# Patient Record
Sex: Male | Born: 1970 | Race: Black or African American | Hispanic: No | Marital: Married | Smoking: Never smoker
Health system: Southern US, Community
[De-identification: ages and names within clinical notes are randomized; demographics above are authoritative.]

---

## 2003-04-10 ENCOUNTER — Encounter: Admission: RE | Admit: 2003-04-10 | Discharge: 2003-04-10 | Payer: Self-pay | Admitting: Family Medicine

## 2003-12-02 ENCOUNTER — Emergency Department (HOSPITAL_COMMUNITY): Admission: EM | Admit: 2003-12-02 | Discharge: 2003-12-02 | Payer: Self-pay | Admitting: Emergency Medicine

## 2003-12-06 ENCOUNTER — Encounter: Admission: RE | Admit: 2003-12-06 | Discharge: 2003-12-06 | Payer: Self-pay | Admitting: Family Medicine

## 2017-09-24 ENCOUNTER — Emergency Department (HOSPITAL_BASED_OUTPATIENT_CLINIC_OR_DEPARTMENT_OTHER): Payer: Self-pay

## 2017-09-24 ENCOUNTER — Encounter (HOSPITAL_BASED_OUTPATIENT_CLINIC_OR_DEPARTMENT_OTHER): Payer: Self-pay | Admitting: *Deleted

## 2017-09-24 ENCOUNTER — Other Ambulatory Visit: Payer: Self-pay

## 2017-09-24 ENCOUNTER — Emergency Department (HOSPITAL_BASED_OUTPATIENT_CLINIC_OR_DEPARTMENT_OTHER)
Admission: EM | Admit: 2017-09-24 | Discharge: 2017-09-24 | Disposition: A | Discharge status: Home / Self Care | Payer: Self-pay | Attending: Emergency Medicine | Admitting: Emergency Medicine

## 2017-09-24 DIAGNOSIS — R197 Diarrhea, unspecified: Secondary | ICD-10-CM | POA: Insufficient documentation

## 2017-09-24 DIAGNOSIS — R0789 Other chest pain: Secondary | ICD-10-CM | POA: Insufficient documentation

## 2017-09-24 DIAGNOSIS — R112 Nausea with vomiting, unspecified: Secondary | ICD-10-CM | POA: Insufficient documentation

## 2017-09-24 DIAGNOSIS — R0602 Shortness of breath: Secondary | ICD-10-CM | POA: Insufficient documentation

## 2017-09-24 LAB — COMPREHENSIVE METABOLIC PANEL
ALT: 27 U/L (ref 0–44)
AST: 34 U/L (ref 15–41)
Albumin: 4.6 g/dL (ref 3.5–5.0)
Alkaline Phosphatase: 67 U/L (ref 38–126)
Anion gap: 12 (ref 5–15)
BUN: 10 mg/dL (ref 6–20)
CO2: 24 mmol/L (ref 22–32)
Calcium: 9.9 mg/dL (ref 8.9–10.3)
Chloride: 103 mmol/L (ref 98–111)
Creatinine, Ser: 1.2 mg/dL (ref 0.61–1.24)
GFR calc Af Amer: >60 mL/min (ref >60)
GFR calc non Af Amer: >60 mL/min (ref >60)
Glucose, Bld: 136 mg/dL — ABNORMAL HIGH (ref 70–99)
Potassium: 3.9 mmol/L (ref 3.5–5.1)
Sodium: 139 mmol/L (ref 135–145)
Total Bilirubin: 1 mg/dL (ref 0.3–1.2)
Total Protein: 8.2 g/dL — ABNORMAL HIGH (ref 6.5–8.1)

## 2017-09-24 LAB — CBC
HCT: 44.1 % (ref 39.0–52.0)
Hemoglobin: 15.9 g/dL (ref 13.0–17.0)
MCH: 32.3 pg (ref 26.0–34.0)
MCHC: 36.1 g/dL — ABNORMAL HIGH (ref 30.0–36.0)
MCV: 89.6 fL (ref 78.0–100.0)
Platelets: 245 10*3/uL (ref 150–400)
RBC: 4.92 MIL/uL (ref 4.22–5.81)
RDW: 11.8 % (ref 11.5–15.5)
WBC: 11.2 10*3/uL — ABNORMAL HIGH (ref 4.0–10.5)

## 2017-09-24 LAB — TROPONIN I
Troponin I: <0.03 ng/mL (ref <0.03)
Troponin I: <0.03 ng/mL (ref <0.03)

## 2017-09-24 LAB — LIPASE, BLOOD: Lipase: 33 U/L (ref 11–51)

## 2017-09-24 MED ORDER — ONDANSETRON HCL 4 MG/2ML IJ SOLN
4.0000 mg | Freq: Once | INTRAMUSCULAR | Status: AC
  Administered 2017-09-24: 4 mg via INTRAVENOUS
  Filled 2017-09-24: qty 2

## 2017-09-24 MED ORDER — IOPAMIDOL (ISOVUE-370) INJECTION 76%
100.0000 mL | Freq: Once | INTRAVENOUS | Status: AC | PRN
  Administered 2017-09-24: 100 mL via INTRAVENOUS

## 2017-09-24 MED ORDER — MORPHINE SULFATE (PF) 4 MG/ML IV SOLN
4.0000 mg | Freq: Once | INTRAVENOUS | Status: AC
  Administered 2017-09-24: 4 mg via INTRAVENOUS
  Filled 2017-09-24: qty 1

## 2017-09-24 MED ORDER — ONDANSETRON 4 MG PO TBDP: 4.0000 mg | ORAL_TABLET | Freq: Three times a day (TID) | ORAL | 0 refills | Status: AC | PRN

## 2017-09-24 MED ORDER — SODIUM CHLORIDE 0.9 % IV BOLUS
500.0000 mL | Freq: Once | INTRAVENOUS | Status: AC
  Administered 2017-09-24: 500 mL via INTRAVENOUS

## 2017-09-24 NOTE — ED Notes (Signed)
Pt transported to CT at this time.

## 2017-09-24 NOTE — ED Notes (Signed)
ED Provider at bedside. 

## 2017-09-24 NOTE — ED Notes (Signed)
Pt provided with ginger ale at this time.

## 2017-09-24 NOTE — ED Triage Notes (Signed)
Vomiting last night. Today his left chest has been tight. He is pale and SOB.

## 2017-09-24 NOTE — ED Provider Notes (Signed)
[facility] EMERGENCY DEPARTMENT Provider Note   CSN: [id number] Arrival date & time: 09/24/17  1105     History   Chief Complaint Chief Complaint  Patient presents with  . Chest Pain    HPI Patrick Meyer is a 47 y.o. male.  The history is provided by the patient. No language interpreter was used.  Chest Pain     Patrick Meyer is a 47 y.o. male who presents to the Emergency Department complaining of chest pain, vomiting. Resents to the emergency department complaining of nausea, vomiting, chest pain and shortness of breath. Last night he began feeling sick with nausea with numerous episodes of emesis and a few episodes of diarrhea. He has associated shortness of breath. Around two or three this morning he developed chest tightness as well. He denies any fevers, cough, hematochezia, melena, hematemesis. He has no medical problems and takes no medications. He does not use tobacco, alcohol or drugs. History reviewed. No pertinent past medical history.  There are no active problems to display for this patient.   History reviewed. No pertinent surgical history.      Home Medications    Prior to Admission medications   Medication Sig Start Date End Date Taking? Authorizing Provider  ondansetron (ZOFRAN ODT) 4 MG disintegrating tablet Take 1 tablet (4 mg total) by mouth every 8 (eight) hours as needed for nausea or vomiting. 09/24/17   Tilden Fossaees, Tawni Melkonian, MD    Family History No family history on file.  Social History Social History   Tobacco Use  . Smoking status: Never Smoker  . Smokeless tobacco: Never Used  Substance Use Topics  . Alcohol use: Never    Frequency: Never  . Drug use: Never     Allergies   Patient has no known allergies.   Review of Systems Review of Systems  Cardiovascular: Positive for chest pain.  All other systems reviewed and are negative.    Physical Exam Updated Vital Signs BP 135/72 (BP Location: Right Arm)    Pulse 93   Temp 98.6 F (37 C) (Oral)   Resp 18   Ht 6\' 1"  (1.854 m)   Wt 104.3 kg   SpO2 98%   BMI 30.34 kg/m   Physical Exam  Constitutional: He is oriented to person, place, and time. He appears well-developed and well-nourished. He appears distressed.  Uncomfortable appearing  HENT:  Head: Normocephalic and atraumatic.  Cardiovascular: Normal rate and regular rhythm.  No murmur heard. Pulmonary/Chest: Effort normal and breath sounds normal. No respiratory distress.  Abdominal: Soft. There is no tenderness. There is no rebound and no guarding.  Musculoskeletal: He exhibits no edema or tenderness.  Neurological: He is alert and oriented to person, place, and time.  Skin: Skin is warm and dry.  Psychiatric: He has a normal mood and affect. His behavior is normal.  Nursing note and vitals reviewed.    ED Treatments / Results  Labs (all labs ordered are listed, but only abnormal results are displayed) Labs Reviewed  CBC - Abnormal; Notable for the following components:      Result Value   WBC 11.2 (*)    MCHC 36.1 (*)    All other components within normal limits  COMPREHENSIVE METABOLIC PANEL - Abnormal; Notable for the following components:   Glucose, Bld 136 (*)    Total Protein 8.2 (*)    All other components within normal limits  TROPONIN I  LIPASE, BLOOD  TROPONIN I  EKG EKG Interpretation  Date/Time:  Friday September 24 2017 11:18:22 EDT Ventricular Rate:  91 PR Interval:    QRS Duration: 87 QT Interval:  336 QTC Calculation: 414 R Axis:   71 Text Interpretation:  Sinus rhythm Borderline T wave abnormalities Minimal ST elevation, inferior leads Baseline wander in lead(s) V1 V2 V3 V4 V5 V6 no prior available for comparison Confirmed by Tilden Fossa [phone]) on 09/24/2017 11:24:26 AM   Radiology Dg Chest 2 View  Result Date: 09/24/2017 CLINICAL DATA:  Chest pain EXAM: CHEST - 2 VIEW COMPARISON:  None. FINDINGS: Lungs are clear. Heart size and pulmonary  vascularity are normal. No adenopathy. No pneumothorax. No bone lesions. IMPRESSION: No edema or consolidation. Electronically Signed   By: Bretta Bang III M.D.   On: 09/24/2017 12:19   Ct Angio Chest/abd/pel For Dissection W And/or W/wo  Result Date: 09/24/2017 CLINICAL DATA:  Vomiting. Chest pain and shortness of breath. Evaluate for thoracic aortic dissection EXAM: CT ANGIOGRAPHY CHEST, ABDOMEN AND PELVIS TECHNIQUE: Multidetector CT imaging through the chest, abdomen and pelvis was performed using the standard protocol during bolus administration of intravenous contrast. Multiplanar reconstructed images and MIPs were obtained and reviewed to evaluate the vascular anatomy. CONTRAST:  ISOVUE-370 IOPAMIDOL (ISOVUE-370) INJECTION 76% COMPARISON:  None. FINDINGS: CTA CHEST FINDINGS Vascular Findings: Review of the precontrast images are negative for the presence of an intramural hematoma. No evidence of thoracic aortic aneurysm with measurements as follows. No evidence of thoracic aortic dissection or periaortic stranding on this nongated examination. Bovine configuration of the aortic arch. The branch vessels of the aortic arch appear widely patent throughout their imaged course. Normal heart size.  No pericardial effusion. Although this examination was not tailored for the evaluation the pulmonary arteries, there are no discrete filling defects within the central pulmonary arterial tree to suggest central pulmonary embolism. Normal caliber of the main pulmonary artery. ------------------------------------------------------------- Thoracic aortic measurements: Sinotubular junction 31 mm as measured in greatest oblique coronal dimension. Proximal ascending aorta 30 mm as measured in greatest oblique axial dimension at the level of the main pulmonary artery. Aortic arch aorta 23 mm as measured in greatest oblique sagittal dimension. Proximal descending thoracic aorta 21 mm as measured in greatest oblique  axial dimension at the level of the main pulmonary artery. Distal descending thoracic aorta 23 mm as measured in greatest oblique axial dimension at the level of the diaphragmatic hiatus. Review of the MIP images confirms the above findings. ------------------------------------------------------------- Non-Vascular Findings: Mediastinum/Lymph Nodes: No bulky mediastinal, hilar axillary lymphadenopathy. There is a minimal amount of ill-defined soft tissue within the anterior mediastinum which is favored to represent residual thymic tissue. No associated mass effect. Lungs/Pleura: No focal airspace opacities. No pleural effusion or pneumothorax. The central pulmonary airways appear widely patent. No discrete pulmonary nodules. Musculoskeletal: Regional soft tissues appear normal. No radiopaque foreign body. Moderate DDD of C6-C7 with disc space height loss, endplate irregularity and sclerosis. _________________________________________________________ _________________________________________________________ CTA ABDOMEN AND PELVIS FINDINGS VASCULAR Aorta: Normal caliber of the abdominal aorta. No significant atherosclerotic plaque. No abdominal aortic dissection or periaortic stranding. Celiac: Widely patent without hemodynamically significant narrowing. Conventional branching pattern. SMA: Widely patent without hemodynamically significant narrowing. Conventional branching pattern the distal tributaries the SMA appear widely patent without discrete intraluminal filling defect to suggest distal embolism. Renals: The bilateral renal arteries appear widely patent. No vessel irregularity to suggest FMD. IMA: Widely patent. Inflow: The bilateral common, external and internal iliac arteries are of normal caliber and widely patent  without hemodynamically significant stenosis. Veins: The IVC and pelvic venous system appear widely patent on this arterial phase examination. Review of the MIP images confirms the above findings.  NON-VASCULAR Evaluation the abdominal organs is limited to the arterial phase of enhancement. Hepatobiliary: Normal hepatic contour. Punctate (approximately 0.6 cm) hypoattenuating lesion within the left lobe of the liver is too small to adequately characterize though favored to represent a hepatic cyst. No discrete hyperenhancing hepatic lesions. Normal appearance of the gallbladder given degree distention. No radiopaque gallstones. No intra extrahepatic bili duct dilatation. No ascites. Pancreas: Normal appearance of the pancreas Spleen: Normal appearance of the spleen. Adrenals/Urinary Tract: There is symmetric enhancement of the bilateral kidneys. No definite renal stones this postcontrast examination. No discrete renal lesions. No urine obstruction or perinephric stranding. Normal appearance the bilateral adrenal glands. Normal appearance of the urinary bladder given degree distention. Stomach/Bowel: The bowel is normal in course and caliber without wall thickening or evidence of enteric obstruction. Normal appearance of the terminal ileum and appendix. No pneumoperitoneum, pneumatosis or portal venous gas. Lymphatic: No bulky retroperitoneal, mesenteric, pelvic or inguinal lymphadenopathy. Reproductive: Borderline enlarged prostate gland. Multiple phleboliths are seen with the lower pelvis bilaterally, left greater than right. Note is made of a small left-sided suspected varicocele (representative image 195, series 6). Other: Regional soft tissues appear normal. Musculoskeletal: No acute or aggressive osseous abnormalities. 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 Review of the MIP images confirms the above findings. IMPRESSION: 1. No acute cardiopulmonary disease. Specifically, no evidence of thoracic aortic aneurysm, dissection or central pulmonary embolism. 2. No acute findings within the abdomen or pelvis.  Electronically Signed   By: Simonne Come M.D.   On: 09/24/2017 13:46    Procedures Procedures (including critical care time)  Medications Ordered in ED Medications  ondansetron (ZOFRAN) injection 4 mg (4 mg Intravenous Given 09/24/17 1129)  sodium chloride 0.9 % bolus 500 mL (0 mLs Intravenous Stopped 09/24/17 1226)  morphine 4 MG/ML injection 4 mg (4 mg Intravenous Given 09/24/17 1142)  iopamidol (ISOVUE-370) 76 % injection 100 mL (100 mLs Intravenous Contrast Given 09/24/17 1316)     Initial Impression / Assessment and Plan / ED Course  I have reviewed the triage vital signs and the nursing notes.  Pertinent labs & imaging results that were available during my care of the patient were reviewed by me and considered in my medical decision making (see chart for details).    Pt here for evaluation of chest pain, nausea after suffering nausea vomiting and diarrhea overnight. Patient distress on ED arrival and uncomfortable appearing. EKG without acute ischemic changes. Following pain medications and antiemetics he is feeling significantly improved. Initial concern for possible dissection versus esophageal rupture. CTA dissection protocol obtained and is negative for acute perforation or dissection. Patient is able to tolerate oral's in the department without difficulty. Plan to discharge home with outpatient follow-up and return precautions. Final Clinical Impressions(s) / ED Diagnoses   Final diagnoses:  Atypical chest pain  Nausea vomiting and diarrhea    ED Discharge Orders         Ordered    ondansetron (ZOFRAN ODT) 4 MG disintegrating tablet  Every 8 hours PRN     09/24/17 1527           Tilden Fossa, MD 09/24/17 1549

## 2017-09-24 NOTE — ED Notes (Signed)
Pt to XR

## 2017-09-24 NOTE — ED Notes (Signed)
2 L oxygen applied to patient.  

## 2017-09-24 NOTE — ED Notes (Signed)
Pt returned from X Ray.

## 2017-09-25 ENCOUNTER — Observation Stay (HOSPITAL_BASED_OUTPATIENT_CLINIC_OR_DEPARTMENT_OTHER)
Admission: EM | Admit: 2017-09-25 | Discharge: 2017-09-26 | Disposition: A | Discharge status: Home / Self Care | Payer: Self-pay | Attending: Internal Medicine | Admitting: Internal Medicine

## 2017-09-25 ENCOUNTER — Other Ambulatory Visit: Payer: Self-pay

## 2017-09-25 ENCOUNTER — Encounter (HOSPITAL_BASED_OUTPATIENT_CLINIC_OR_DEPARTMENT_OTHER): Payer: Self-pay | Admitting: Emergency Medicine

## 2017-09-25 ENCOUNTER — Emergency Department (HOSPITAL_BASED_OUTPATIENT_CLINIC_OR_DEPARTMENT_OTHER): Payer: Self-pay

## 2017-09-25 DIAGNOSIS — N179 Acute kidney failure, unspecified: Secondary | ICD-10-CM | POA: Insufficient documentation

## 2017-09-25 DIAGNOSIS — K529 Noninfective gastroenteritis and colitis, unspecified: Principal | ICD-10-CM | POA: Insufficient documentation

## 2017-09-25 DIAGNOSIS — R0789 Other chest pain: Secondary | ICD-10-CM | POA: Insufficient documentation

## 2017-09-25 DIAGNOSIS — E872 Acidosis: Secondary | ICD-10-CM | POA: Insufficient documentation

## 2017-09-25 DIAGNOSIS — R197 Diarrhea, unspecified: Secondary | ICD-10-CM

## 2017-09-25 DIAGNOSIS — R6510 Systemic inflammatory response syndrome (SIRS) of non-infectious origin without acute organ dysfunction: Secondary | ICD-10-CM

## 2017-09-25 DIAGNOSIS — E860 Dehydration: Secondary | ICD-10-CM | POA: Insufficient documentation

## 2017-09-25 DIAGNOSIS — R0602 Shortness of breath: Secondary | ICD-10-CM | POA: Insufficient documentation

## 2017-09-25 DIAGNOSIS — R1013 Epigastric pain: Secondary | ICD-10-CM

## 2017-09-25 DIAGNOSIS — R112 Nausea with vomiting, unspecified: Secondary | ICD-10-CM

## 2017-09-25 DIAGNOSIS — R079 Chest pain, unspecified: Secondary | ICD-10-CM | POA: Diagnosis present

## 2017-09-25 LAB — RAPID URINE DRUG SCREEN, HOSP PERFORMED
Amphetamines: NOT DETECTED
BARBITURATES: NOT DETECTED
Benzodiazepines: NOT DETECTED
COCAINE: NOT DETECTED
Opiates: POSITIVE — AB
Tetrahydrocannabinol: NOT DETECTED

## 2017-09-25 LAB — COMPREHENSIVE METABOLIC PANEL
ALBUMIN: 4.5 g/dL (ref 3.5–5.0)
ALT: 26 U/L (ref 0–44)
ANION GAP: 13 (ref 5–15)
AST: 35 U/L (ref 15–41)
Alkaline Phosphatase: 64 U/L (ref 38–126)
BUN: 16 mg/dL (ref 6–20)
CHLORIDE: 103 mmol/L (ref 98–111)
CO2: 25 mmol/L (ref 22–32)
Calcium: 9.2 mg/dL (ref 8.9–10.3)
Creatinine, Ser: 1.4 mg/dL — ABNORMAL HIGH (ref 0.61–1.24)
GFR calc non Af Amer: 58 mL/min — ABNORMAL LOW (ref >60)
GLUCOSE: 125 mg/dL — AB (ref 70–99)
POTASSIUM: 3.6 mmol/L (ref 3.5–5.1)
Sodium: 141 mmol/L (ref 135–145)
Total Bilirubin: 0.9 mg/dL (ref 0.3–1.2)
Total Protein: 7.9 g/dL (ref 6.5–8.1)

## 2017-09-25 LAB — URINALYSIS, ROUTINE W REFLEX MICROSCOPIC
Bilirubin Urine: NEGATIVE
Glucose, UA: NEGATIVE mg/dL
Ketones, ur: 15 mg/dL — AB
Leukocytes, UA: NEGATIVE
NITRITE: NEGATIVE
PH: 8.5 — AB (ref 5.0–8.0)
Protein, ur: NEGATIVE mg/dL
Specific Gravity, Urine: 1.015 (ref 1.005–1.030)

## 2017-09-25 LAB — CBC WITH DIFFERENTIAL/PLATELET
Basophils Absolute: 0 10*3/uL (ref 0.0–0.1)
Basophils Relative: 0 %
Eosinophils Absolute: 0 10*3/uL (ref 0.0–0.7)
Eosinophils Relative: 0 %
HCT: 43.5 % (ref 39.0–52.0)
Hemoglobin: 15.6 g/dL (ref 13.0–17.0)
Lymphocytes Relative: 27 %
Lymphs Abs: 2.2 10*3/uL (ref 0.7–4.0)
MCH: 32.7 pg (ref 26.0–34.0)
MCHC: 35.9 g/dL (ref 30.0–36.0)
MCV: 91.2 fL (ref 78.0–100.0)
Monocytes Absolute: 0.6 10*3/uL (ref 0.1–1.0)
Monocytes Relative: 8 %
Neutro Abs: 5.3 10*3/uL (ref 1.7–7.7)
Neutrophils Relative %: 65 %
Platelets: 232 10*3/uL (ref 150–400)
RBC: 4.77 MIL/uL (ref 4.22–5.81)
RDW: 12.3 % (ref 11.5–15.5)
WBC: 8.2 10*3/uL (ref 4.0–10.5)

## 2017-09-25 LAB — APTT: APTT: 27 s (ref 24–36)

## 2017-09-25 LAB — I-STAT VENOUS BLOOD GAS, ED
Bicarbonate: 24.4 mmol/L (ref 20.0–28.0)
O2 Saturation: 58 %
PCO2 VEN: 40.7 mmHg — AB (ref 44.0–60.0)
PO2 VEN: 32 mmHg (ref 32.0–45.0)
Patient temperature: 99.4
TCO2: 26 mmol/L (ref 22–32)
pH, Ven: 7.388 (ref 7.250–7.430)

## 2017-09-25 LAB — URINALYSIS, MICROSCOPIC (REFLEX)
Bacteria, UA: NONE SEEN
Squamous Epithelial / LPF: NONE SEEN (ref 0–5)
WBC, UA: NONE SEEN WBC/hpf (ref 0–5)

## 2017-09-25 LAB — I-STAT CG4 LACTIC ACID, ED
LACTIC ACID, VENOUS: 4.13 mmol/L — AB (ref 0.5–1.9)
LACTIC ACID, VENOUS: 4.21 mmol/L — AB (ref 0.5–1.9)
LACTIC ACID, VENOUS: 2.26 mmol/L — AB (ref 0.5–1.9)

## 2017-09-25 LAB — PROTIME-INR
INR: 1.02
PROTHROMBIN TIME: 13.3 s (ref 11.4–15.2)

## 2017-09-25 LAB — MRSA PCR SCREENING: MRSA by PCR: NEGATIVE

## 2017-09-25 LAB — CBG MONITORING, ED: GLUCOSE-CAPILLARY: 95 mg/dL (ref 70–99)

## 2017-09-25 LAB — LIPASE, BLOOD: Lipase: 38 U/L (ref 11–51)

## 2017-09-25 LAB — TROPONIN I

## 2017-09-25 LAB — CK: Total CK: 486 U/L — ABNORMAL HIGH (ref 49–397)

## 2017-09-25 MED ORDER — VANCOMYCIN HCL IN DEXTROSE 1-5 GM/200ML-% IV SOLN
1000.0000 mg | Freq: Once | INTRAVENOUS | Status: AC
  Administered 2017-09-25: 1000 mg via INTRAVENOUS
  Filled 2017-09-25: qty 200

## 2017-09-25 MED ORDER — HYDROCODONE-ACETAMINOPHEN 5-325 MG PO TABS: 1.0000 | ORAL_TABLET | ORAL | Status: DC | PRN

## 2017-09-25 MED ORDER — SODIUM CHLORIDE 0.9 % IV BOLUS (SEPSIS)
1000.0000 mL | Freq: Once | INTRAVENOUS | Status: AC
  Administered 2017-09-25: 1000 mL via INTRAVENOUS

## 2017-09-25 MED ORDER — ONDANSETRON HCL 4 MG/2ML IJ SOLN: 4.0000 mg | Freq: Four times a day (QID) | INTRAMUSCULAR | Status: DC | PRN

## 2017-09-25 MED ORDER — ENOXAPARIN SODIUM 40 MG/0.4ML ~~LOC~~ SOLN: 40.0000 mg | Freq: Every day | SUBCUTANEOUS | Status: DC

## 2017-09-25 MED ORDER — ACETAMINOPHEN 325 MG PO TABS: 650.0000 mg | ORAL_TABLET | Freq: Four times a day (QID) | ORAL | Status: DC | PRN

## 2017-09-25 MED ORDER — ACETAMINOPHEN 650 MG RE SUPP: 650.0000 mg | Freq: Four times a day (QID) | RECTAL | Status: DC | PRN

## 2017-09-25 MED ORDER — SODIUM CHLORIDE 0.9 % IV BOLUS (SEPSIS): 1000.0000 mL | Freq: Once | INTRAVENOUS | Status: DC

## 2017-09-25 MED ORDER — VANCOMYCIN HCL IN DEXTROSE 1-5 GM/200ML-% IV SOLN
1000.0000 mg | Freq: Once | INTRAVENOUS | Status: AC
  Administered 2017-09-25: 1000 mg via INTRAVENOUS

## 2017-09-25 MED ORDER — SODIUM CHLORIDE 0.9 % IV BOLUS (SEPSIS)
500.0000 mL | Freq: Once | INTRAVENOUS | Status: AC
  Administered 2017-09-25: 500 mL via INTRAVENOUS

## 2017-09-25 MED ORDER — VANCOMYCIN HCL IN DEXTROSE 750-5 MG/150ML-% IV SOLN
750.0000 mg | Freq: Two times a day (BID) | INTRAVENOUS | Status: DC
  Administered 2017-09-26: 750 mg via INTRAVENOUS
  Filled 2017-09-25 (×2): qty 150

## 2017-09-25 MED ORDER — PIPERACILLIN-TAZOBACTAM 3.375 G IVPB 30 MIN
3.3750 g | Freq: Once | INTRAVENOUS | Status: AC
  Administered 2017-09-25: 3.375 g via INTRAVENOUS
  Filled 2017-09-25 (×2): qty 50

## 2017-09-25 MED ORDER — SODIUM CHLORIDE 0.9 % IV SOLN
INTRAVENOUS | Status: AC
  Administered 2017-09-25: via INTRAVENOUS

## 2017-09-25 MED ORDER — VANCOMYCIN HCL IN DEXTROSE 1-5 GM/200ML-% IV SOLN
1000.0000 mg | Freq: Once | INTRAVENOUS | Status: DC
  Filled 2017-09-25: qty 200

## 2017-09-25 MED ORDER — ONDANSETRON HCL 4 MG/2ML IJ SOLN
4.0000 mg | Freq: Once | INTRAMUSCULAR | Status: AC
  Administered 2017-09-25: 4 mg via INTRAVENOUS
  Filled 2017-09-25: qty 2

## 2017-09-25 MED ORDER — SODIUM CHLORIDE 0.9 % IV BOLUS
1000.0000 mL | Freq: Once | INTRAVENOUS | Status: AC
  Administered 2017-09-25: 1000 mL via INTRAVENOUS

## 2017-09-25 MED ORDER — IOPAMIDOL (ISOVUE-300) INJECTION 61%
100.0000 mL | Freq: Once | INTRAVENOUS | Status: AC | PRN
  Administered 2017-09-25: 100 mL via INTRAVENOUS

## 2017-09-25 MED ORDER — MORPHINE SULFATE (PF) 4 MG/ML IV SOLN
4.0000 mg | Freq: Once | INTRAVENOUS | Status: AC
  Administered 2017-09-25: 4 mg via INTRAVENOUS
  Filled 2017-09-25: qty 1

## 2017-09-25 MED ORDER — PIPERACILLIN-TAZOBACTAM 3.375 G IVPB
3.3750 g | Freq: Three times a day (TID) | INTRAVENOUS | Status: DC
  Administered 2017-09-25 – 2017-09-26 (×2): 3.375 g via INTRAVENOUS
  Filled 2017-09-25 (×2): qty 50

## 2017-09-25 MED ORDER — ONDANSETRON HCL 4 MG PO TABS: 4.0000 mg | ORAL_TABLET | Freq: Four times a day (QID) | ORAL | Status: DC | PRN

## 2017-09-25 MED ORDER — POTASSIUM CHLORIDE CRYS ER 20 MEQ PO TBCR
20.0000 meq | EXTENDED_RELEASE_TABLET | Freq: Once | ORAL | Status: AC
  Administered 2017-09-25: 20 meq via ORAL
  Filled 2017-09-25: qty 1

## 2017-09-25 NOTE — ED Notes (Signed)
Pt aware of the need for a urine sample. Unable to void at this time. Urinal given.

## 2017-09-25 NOTE — Plan of Care (Signed)
47 y.o. male  complaining of chest pain, vomiting. Few episodes of diarrhea yesterday CTA was unremarkable tachypnea Trop X2 negative 3 Lactic acid 4 despite IVF Temp 100.2 Not on any meds CT abd non acute- no stone today Vanc and Zosyn tachypneic  Normotensive but very ill appearing Cr up to 1.4 Lipase 38 Accepted to Stepdown  For observation for SIRS Sepsis protocol  Added VBG and CK  Tasnim Balentine 8:50 PM

## 2017-09-25 NOTE — ED Notes (Signed)
X-ray delay, patient getting IV

## 2017-09-25 NOTE — ED Provider Notes (Addendum)
[facility] EMERGENCY DEPARTMENT Provider Note   CSN: [id number] Arrival date & time: 09/25/17  1501     History   Chief Complaint Chief Complaint  Patient presents with  . Chest Pain    HPI Patrick Meyer is a 47 y.o. male.  HPI  History reviewed. No pertinent past medical history.  Patient Active Problem List   Diagnosis Date Noted  . SIRS (systemic inflammatory response syndrome) (HCC) 09/25/2017    History reviewed. No pertinent surgical history.      Home Medications    Prior to Admission medications   Medication Sig Start Date End Date Taking? Authorizing Provider  ondansetron (ZOFRAN ODT) 4 MG disintegrating tablet Take 1 tablet (4 mg total) by mouth every 8 (eight) hours as needed for nausea or vomiting. 09/24/17   Tilden Fossaees, Elizabeth, MD    Family History History reviewed. No pertinent family history.  Social History Social History   Tobacco Use  . Smoking status: Never Smoker  . Smokeless tobacco: Never Used  Substance Use Topics  . Alcohol use: Never    Frequency: Never  . Drug use: Never     Allergies   Patient has no known allergies.   Review of Systems Review of Systems  Constitutional: Positive for chills. Negative for fever.  HENT: Negative.   Eyes: Negative for visual disturbance.  Respiratory: Positive for shortness of breath. Negative for cough, chest tightness and wheezing.   Cardiovascular: Positive for chest pain. Negative for palpitations and leg swelling.  Gastrointestinal: Positive for abdominal pain, nausea and vomiting. Negative for blood in stool, constipation and diarrhea.  Genitourinary: Negative for dysuria and frequency.  Musculoskeletal: Negative for arthralgias, back pain, myalgias, neck pain and neck stiffness.  Skin: Negative for color change, rash and wound.  Neurological: Negative for dizziness, syncope, weakness, light-headedness, numbness and headaches.     Physical Exam Updated Vital Signs BP  (!) 154/84 (BP Location: Right Arm)   Pulse 76   Temp 98.1 F (36.7 C)   Resp (!) 28   Ht 6\' 1"  (1.854 m)   Wt 104.3 kg   SpO2 100%   BMI 30.34 kg/m   Physical Exam  Constitutional: He is oriented to person, place, and time. He appears well-developed and well-nourished. He appears toxic. He appears ill.  Patient is pale, diaphoretic and ill-appearing, actively but it just prior to my evaluation  HENT:  Head: Normocephalic and atraumatic.  Mouth/Throat: Oropharynx is clear and moist.  Eyes: Pupils are equal, round, and reactive to light. EOM are normal. Right eye exhibits no discharge. Left eye exhibits no discharge.  Neck: Normal range of motion. Neck supple.  No rigidity  Cardiovascular: Normal rate, regular rhythm, normal heart sounds and intact distal pulses. Exam reveals no gallop and no friction rub.  No murmur heard. Pulses:      Radial pulses are 2+ on the right side, and 2+ on the left side.       Dorsalis pedis pulses are 2+ on the right side.       Posterior tibial pulses are 2+ on the right side, and 2+ on the left side.  Pulmonary/Chest: Effort normal and breath sounds normal. No stridor. No respiratory distress. He has no wheezes. He has no rales.  Respirations equal and unlabored, patient intermittently tachypneic, patient able to speak in full sentences, lungs clear to auscultation bilaterally  Abdominal: Soft. Bowel sounds are normal. He exhibits no distension and no mass. There is tenderness. There is  no rebound and no guarding.  Abdomen soft, nondistended, bowel sounds present throughout, there is mild epigastric tenderness without guarding, all other quadrants nontender to palpation, no CVA tenderness bilaterally  Musculoskeletal: He exhibits no edema or deformity.  No midline spinal tenderness. All joints supple and easily movable without swelling or erythema, all compartments soft  Neurological: He is alert and oriented to person, place, and time.  Speech is  clear, able to follow commands CN III-XII intact Normal strength in upper and lower extremities bilaterally including dorsiflexion and plantar flexion, strong and equal grip strength Sensation normal to light and sharp touch Moves extremities without ataxia, coordination intact  Skin: Skin is warm. Capillary refill takes less than 2 seconds. He is diaphoretic.  No wounds or rashes noted, there is a chronic appearing darkened area on the lateral aspect of the right calf without surrounding erythema or palpable fluctuance  Nursing note and vitals reviewed.    ED Treatments / Results  Labs (all labs ordered are listed, but only abnormal results are displayed) Labs Reviewed  URINALYSIS, ROUTINE W REFLEX MICROSCOPIC - Abnormal; Notable for the following components:      Result Value   APPearance CLOUDY (*)    pH 8.5 (*)    Hgb urine dipstick TRACE (*)    Ketones, ur 15 (*)    All other components within normal limits  COMPREHENSIVE METABOLIC PANEL - Abnormal; Notable for the following components:   Glucose, Bld 125 (*)    Creatinine, Ser 1.40 (*)    GFR calc non Af Amer 58 (*)    All other components within normal limits  I-STAT CG4 LACTIC ACID, ED - Abnormal; Notable for the following components:   Lactic Acid, Venous 4.13 (*)    All other components within normal limits  I-STAT CG4 LACTIC ACID, ED - Abnormal; Notable for the following components:   Lactic Acid, Venous 4.21 (*)    All other components within normal limits  CULTURE, BLOOD (ROUTINE X 2)  CULTURE, BLOOD (ROUTINE X 2)  CBC WITH DIFFERENTIAL/PLATELET  TROPONIN I  LIPASE, BLOOD  URINALYSIS, MICROSCOPIC (REFLEX)  TROPONIN I  LACTIC ACID, PLASMA  LACTIC ACID, PLASMA  RAPID URINE DRUG SCREEN, HOSP PERFORMED  SODIUM, URINE, RANDOM  CREATININE, URINE, RANDOM  LACTIC ACID, PLASMA  LACTIC ACID, PLASMA  PROTIME-INR  APTT  BLOOD GAS, VENOUS  CK  PROCALCITONIN  CBG MONITORING, ED  I-STAT CG4 LACTIC ACID, ED     EKG EKG Interpretation  Date/Time:  Saturday September 25 2017 15:35:04 EDT Ventricular Rate:  77 PR Interval:    QRS Duration: 93 QT Interval:  368 QTC Calculation: 417 R Axis:   71 Text Interpretation:  Sinus rhythm Borderline ST elevation, inferior leads pt is shaky, but rhythm is NSR Confirmed by Jacalyn Lefevre [phone]) on 09/25/2017 3:45:54 PM   Radiology Dg Chest 2 View  Result Date: 09/25/2017 CLINICAL DATA:  Chest tightness and vomiting for the past 3 days. EXAM: CHEST - 2 VIEW COMPARISON:  Chest radiographs and chest CTA obtained yesterday. FINDINGS: The heart size and mediastinal contours are within normal limits. Both lungs are clear. The visualized skeletal structures are unremarkable. IMPRESSION: Normal examination. Electronically Signed   By: Beckie Salts M.D.   On: 09/25/2017 16:54    Dg Abdomen 1 View  Result Date: 09/25/2017 CLINICAL DATA:  Chest tightness and vomiting for the past 3 days. EXAM: ABDOMEN - 1 VIEW COMPARISON:  Gallbladder ultrasound dated 12/06/2003. FINDINGS: Normal bowel gas pattern. Multiple  pelvic phleboliths. Normal appearing bones. IMPRESSION: No acute abnormality. Electronically Signed   By: Beckie Salts M.D.   On: 09/25/2017 16:55   Ct Abdomen Pelvis W Contrast  Result Date: 09/25/2017 CLINICAL DATA:  Abdominal pain. EXAM: CT ABDOMEN AND PELVIS WITH CONTRAST TECHNIQUE: Multidetector CT imaging of the abdomen and pelvis was performed using the standard protocol following bolus administration of intravenous contrast. CONTRAST:  ISOVUE-300 IOPAMIDOL (ISOVUE-300) INJECTION 61% COMPARISON:  None FINDINGS: Lower chest: No acute abnormality. Hepatobiliary: 7 mm low-density structure within left lobe of liver is too small to characterize. No suspicious liver abnormalities noted. Gallbladder appears normal. No biliary dilatation. Pancreas: Unremarkable. No pancreatic ductal dilatation or surrounding inflammatory changes. Spleen: Small low density  structure within the spleen measures 6 mm, image 13/2. Likely benign. Spleen otherwise unremarkable. Adrenals/Urinary Tract: The adrenal glands are unremarkable. No kidney mass or hydronephrosis. The urinary bladder is unremarkable. Stomach/Bowel: The stomach is normal. Small bowel loops have a normal course and caliber. No wall thickening or inflammation identified. The appendix is visualized and appears within normal limits. No pathologic dilatation of the colon. Vascular/Lymphatic: Normal appearance of the abdominal aorta. No enlarged retroperitoneal or mesenteric adenopathy. No enlarged pelvic or inguinal lymph nodes. Reproductive: Prostate is unremarkable. Other: No abdominal wall hernia or abnormality. No abdominopelvic ascites. Musculoskeletal: Degenerative disc disease identified at L4-5 and L5-S1. No the or significant osseous findings. IMPRESSION: No acute findings within the abdomen or pelvis. No explanation for patient's abdominal pain. Electronically Signed   By: Signa Kell M.D.   On: 09/25/2017 20:00    Procedures .Critical Care Performed by: Dartha Lodge, PA-C Authorized by: Dartha Lodge, PA-C   Critical care provider statement:    Critical care time (minutes):  45   Critical care time was exclusive of:  Separately billable procedures and treating other patients   Critical care was necessary to treat or prevent imminent or life-threatening deterioration of the following conditions:  Sepsis   Critical care was time spent personally by me on the following activities:  Discussions with consultants, evaluation of patient's response to treatment, examination of patient, ordering and performing treatments and interventions, ordering and review of laboratory studies, ordering and review of radiographic studies, pulse oximetry, re-evaluation of patient's condition, obtaining history from patient or surrogate and review of old charts   (including critical care time)  Medications Ordered  in ED Medications  piperacillin-tazobactam (ZOSYN) IVPB 3.375 g (has no administration in time range)  vancomycin (VANCOCIN) IVPB 750 mg/150 ml premix (has no administration in time range)  ondansetron (ZOFRAN) injection 4 mg (4 mg Intravenous Given 09/25/17 1543)  morphine 4 MG/ML injection 4 mg (4 mg Intravenous Given 09/25/17 1543)  sodium chloride 0.9 % bolus 1,000 mL (0 mLs Intravenous Stopped 09/25/17 2112)  piperacillin-tazobactam (ZOSYN) IVPB 3.375 g (0 g Intravenous Stopped 09/25/17 2112)  sodium chloride 0.9 % bolus 1,000 mL (0 mLs Intravenous Stopped 09/25/17 2112)    And  sodium chloride 0.9 % bolus 1,000 mL (0 mLs Intravenous Stopped 09/25/17 2112)    And  sodium chloride 0.9 % bolus 500 mL (0 mLs Intravenous Stopped 09/25/17 2112)  vancomycin (VANCOCIN) IVPB 1000 mg/200 mL premix (0 mg Intravenous Stopped 09/25/17 2112)    Followed by  vancomycin (VANCOCIN) IVPB 1000 mg/200 mL premix (0 mg Intravenous Stopped 09/25/17 2112)  iopamidol (ISOVUE-300) 61 % injection 100 mL (100 mLs Intravenous Contrast Given 09/25/17 1927)     Initial Impression / Assessment and Plan / ED  Course  I have reviewed the triage vital signs and the nursing notes.  Pertinent labs & imaging results that were available during my care of the patient were reviewed by me and considered in my medical decision making (see chart for details).  Patient presents with persistent chest and abdominal pain, with associated nausea and vomiting.  Was evaluated yesterday and had negative dissections study and cardiac work-up.  Patient returns today with persistent and worsening symptoms.  On arrival patient is acutely ill-appearing, pale and diaphoretic.  Patient initially afebrile with stable vitals.  On exam regular rate and rhythm without obvious murmur, lungs clear to auscultation, minimal epigastric abdominal tenderness without guarding.  No evidence of rashes or wounds.  No headache or midline spinal tenderness, no nuchal  rigidity.  Patient denies urinary symptoms.  Fluid bolus, Zofran and morphine for symptoms, as he seemed to improve significantly after this yesterday.  Will obtain abdominal labs, troponin, as well as blood cultures and lactic acid given acutely ill appearance.  Will get chest x-ray, abdominal upright film and EKG. Rectal temp shows low-grade temperature of 100.2.  Initial lactic acid is 4.13, code sepsis initiated without known source, 30 cc/kg bolus and broad-spectrum antibiotics ordered.  EKG without concerning ischemic changes.  Troponin negative.  Chest x-ray and abdominal x-rays are unremarkable, no evidence of pneumonia or other cardiopulmonary disease.  KUB shows normal bowel gas pattern with no evidence of perforation.  No leukocytosis, normal hemoglobin.  Creatinine is slightly elevated from yesterday at 1.40, no other acute electrolyte derangements.  Urinalysis with trace hemoglobin and some amorphous crystals, but no evidence of infection.  The source for patient's infection but he continues to appear acutely ill, despite aggressive fluid hydration, symptomatic treatment and antibiotics patient patient denies improvement in his symptoms continues to report some abdominal discomfort without focal guarding on exam.  No further vomiting.  Repeat lactic acid is slightly increased at 4.21.  Case discussed with Dr. Particia Nearing who saw and evaluated the patient as well, will get repeat CT abdomen pelvis given persisting pain to look for any new acute pathology or source of infection that could be causing patient's symptoms.  CT abdomen pelvis without any evidence of acute abdominal pathology, images reviewed by myself and Dr. Particia Nearing, agree with radiology findings.  Given patient's continued ill appearance despite symptomatic treatment and rehydration and persistently elevated lactic acid feel patient will require admission for observation and continued assessment.  Consult placed to hospitalist for  admission.  Discussed with Dr. Adela Glimpse with [facility] hospitalist who accepts the patient for transfer and admission.  Vitals:   09/25/17 2000 09/25/17 2015 09/25/17 2030 09/25/17 2100  BP: (!) 154/99  127/83 132/77  Pulse: 75 74 70 86  Resp: 18  16 (!) 22  Temp:      TempSrc:      SpO2: 100% 91% 96% 100%  Weight:      Height:        Final Clinical Impressions(s) / ED Diagnoses   Final diagnoses:  Sepsis, due to unspecified organism [facility])  Epigastric abdominal pain  Atypical chest pain  Non-intractable vomiting with nausea, unspecified vomiting type    ED Discharge Orders    None       Dartha Lodge, PA-C 09/26/17 0012    Dartha Lodge, PA-C 09/26/17 0014    Jacalyn Lefevre, MD 09/26/17 [phone]

## 2017-09-25 NOTE — ED Triage Notes (Signed)
Patient states that he was here yesterday for the "same thing" - patient states that he has not thrown up today - patient is pale and diaphoretic

## 2017-09-25 NOTE — Progress Notes (Signed)
Pharmacy Antibiotic Note  Patrick Meyer is a 47 y.o. male admitted on 09/25/2017 with sepsis.  Pharmacy has been consulted for vancomycin and zosyn dosing. Tmax is 100.2 and WBC is WNL. SCr is 1.4 which is up from yesterday. Lactic acid is elevated at 4.13.   Plan: Vancomycin 2gm IV x 1 then 750mg  IV Q12H Zosyn 3.375gm IV Q8H (4 hr inf) F/u renal fxn, C&S, clinical status and trough at sS  Height: 6\' 1"  (185.4 cm) Weight: 230 lb (104.3 kg) IBW/kg (Calculated) : 79.9  Temp (24hrs), Avg:99.2 F (37.3 C), Min:98.1 F (36.7 C), Max:100.2 F (37.9 C)  Recent Labs  Lab 09/24/17 1124 09/25/17 1537 09/25/17 1539 09/25/17 1554  WBC 11.2* 8.2  --   --   CREATININE 1.20  --  1.40*  --   LATICACIDVEN  --   --   --  4.13*    Estimated Creatinine Clearance: 81 mL/min (A) (by C-G formula based on SCr of 1.4 mg/dL (H)).    No Known Allergies  Antimicrobials this admission: Vanc 8/10>> Zosyn 8/10>>  Dose adjustments this admission: N/A  Microbiology results: Pending  Thank you for allowing pharmacy to be a part of this patient's care.  Merrell Rettinger, Drake LeachRachel Lynn 09/25/2017 4:55 PM

## 2017-09-25 NOTE — H&P (Signed)
Patrick Meyer ONG:[id number] DOB: 07-Jul-1970 DOA: 09/25/2017     PCP: Patient, No Pcp Per   Outpatient Specialists: NONE  Patient arrived to ER on 09/25/17 at 1501  Patient coming from:   home Lives alone,     Chief Complaint:  Chief Complaint  Patient presents with  . Chest Pain    HPI: [name] is a 47 y.o. male with no significant medical history     Presented with   2 days of Nausea and vomiting associated with chest pain that occurred after profuse vomiting. Shortness of breath initially now back to baseline. Presented yesterday to emergency department was noted to be pale and acutely ill CTA of the chest at that time showed no evidence of PE cardiac markers cycled x2. 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 No blood in vomit or stool.  Lipase unremarkable he did have a few episodes of diarrhea but also endorses chest tightness at initially he did not have any fevers or chills patient improved and none was discharged to home unfortunately he presented back again to med [facility] complaining of same symptoms of nausea vomiting.  This time he was noted to have fever up to 100.2 in the emergency department. Patient not on any new medications denied any drug abuse or substance abuse     While in ER: Patient was aggressively rehydrated and initial lactic acid was elevated above 4 and persisted despite IV fluid rehydration per ED provider patient appeared acutely ill CT of abdomen showed no evidence of infectious process chest x-ray unremarkable no white blood cell count elevation Patient was treated for possible SIRS Started on vancomycin and Zosyn Given patient appeared to be acutely ill he was accepted in transfer to stepdown  On arrival lactic acid noted to be down to 2.26 and patient is currently feeling much better  The following Work up has been ordered so far:  Orders Placed This Encounter  Procedures  . Blood culture (routine  x 2)  . MRSA PCR Screening  . DG Chest 2 View  . DG Abdomen 1 View  . CT ABDOMEN PELVIS W CONTRAST  . CBC with Differential  . Urinalysis, Routine w reflex microscopic  . Lactic acid, plasma  . Troponin I  . Comprehensive metabolic panel  . Lipase, blood  . Urinalysis, Microscopic (reflex)  . Troponin I  . Urine rapid drug screen (hosp performed)  . Sodium, urine, random  . Creatinine, urine, random  . Lactic acid, plasma  . Protime-INR  . APTT  . Blood gas, venous  . CK  . Procalcitonin  . Diet NPO time specified  . Check Rectal Temperature  . Cardiac monitoring  . Document vital signs within 1-hour of fluid bolus completion and notify provider of bolus completion  . Document Actual / Estimated Weight  . Insert peripheral IV x 2  . Initiate Carrier Fluid Protocol  . In and Out Cath  . Vital signs  . If lactate (lactic acid) >2, verify repeat lactic acid order has been placed.  . Document vital signs within 1-hour of fluid bolus completion and notify provider of bolus completion  . Vital signs  . Vital signs  . Cardiac monitoring  . Swab Process:  . Considerations:  . If MRSA PCR Screen is Positive:  . Call Code Sepsis([facility] [phone]) Reason for Consult? tracking  . piperacillin-tazobactam (ZOSYN) per pharmacy consult  . vancomycin per pharmacy consult  .  Consult to hospitalist  . Pulse oximetry, continuous  . POC CBG, ED  . I-Stat CG4 Lactic Acid, ED  . I-Stat CG4 Lactic Acid, ED  . I-Stat CG4 Lactic Acid, ED  . I-Stat venous blood gas, ED  . EKG 12-Lead  . EKG 12-Lead  . Saline lock IV  . Place in observation (patient's expected length of stay will be less than 2 midnights)      Following Medications were ordered in ER: Medications  piperacillin-tazobactam (ZOSYN) IVPB 3.375 g (3.375 g Intravenous New Bag/Given 09/25/17 2234)  vancomycin (VANCOCIN) IVPB 750 mg/150 ml premix (has no administration in time range)  ondansetron (ZOFRAN) injection 4 mg  (4 mg Intravenous Given 09/25/17 1543)  morphine 4 MG/ML injection 4 mg (4 mg Intravenous Given 09/25/17 1543)  sodium chloride 0.9 % bolus 1,000 mL (0 mLs Intravenous Stopped 09/25/17 2112)  piperacillin-tazobactam (ZOSYN) IVPB 3.375 g (0 g Intravenous Stopped 09/25/17 2112)  sodium chloride 0.9 % bolus 1,000 mL (0 mLs Intravenous Stopped 09/25/17 2112)    And  sodium chloride 0.9 % bolus 1,000 mL (0 mLs Intravenous Stopped 09/25/17 2112)    And  sodium chloride 0.9 % bolus 500 mL (0 mLs Intravenous Stopped 09/25/17 2112)  vancomycin (VANCOCIN) IVPB 1000 mg/200 mL premix (0 mg Intravenous Stopped 09/25/17 2112)    Followed by  vancomycin (VANCOCIN) IVPB 1000 mg/200 mL premix (0 mg Intravenous Stopped 09/25/17 2112)  iopamidol (ISOVUE-300) 61 % injection 100 mL (100 mLs Intravenous Contrast Given 09/25/17 1927)    Significant initial  Findings: Abnormal Labs Reviewed  URINALYSIS, ROUTINE W REFLEX MICROSCOPIC - Abnormal; Notable for the following components:      Result Value   APPearance CLOUDY (*)    pH 8.5 (*)    Hgb urine dipstick TRACE (*)    Ketones, ur 15 (*)    All other components within normal limits  COMPREHENSIVE METABOLIC PANEL - Abnormal; Notable for the following components:   Glucose, Bld 125 (*)    Creatinine, Ser 1.40 (*)    GFR calc non Af Amer 58 (*)    All other components within normal limits  RAPID URINE DRUG SCREEN, HOSP PERFORMED - Abnormal; Notable for the following components:   Opiates POSITIVE (*)    All other components within normal limits  CK - Abnormal; Notable for the following components:   Total CK 486 (*)    All other components within normal limits  I-STAT CG4 LACTIC ACID, ED - Abnormal; Notable for the following components:   Lactic Acid, Venous 4.13 (*)    All other components within normal limits  I-STAT CG4 LACTIC ACID, ED - Abnormal; Notable for the following components:   Lactic Acid, Venous 4.21 (*)    All other components within normal limits   I-STAT CG4 LACTIC ACID, ED - Abnormal; Notable for the following components:   Lactic Acid, Venous 2.26 (*)    All other components within normal limits  I-STAT VENOUS BLOOD GAS, ED - Abnormal; Notable for the following components:   pCO2, Ven 40.7 (*)    All other components within normal limits   7.388/40.7 INR 1.02 Trop<0.03 CK 486  Lactic acid 4.13  Na 141 K 3.6  Cr    Up from baseline see below Lab Results  Component Value Date   CREATININE 1.40 (H) 09/25/2017   CREATININE 1.20 09/24/2017      WBC 8.2  HG/HCT  Stable,     Component Value Date/Time   HGB  15.6 09/25/2017 1537   HCT 43.5 09/25/2017 1537        Lactic Acid, Venous    Component Value Date/Time   LATICACIDVEN 2.26 (HH) 09/25/2017 2131  down from 4.13    UA   no evidence of UTI     KUB  -  NON acute  CXR -  NON acute  CTabd/pelvis -  nonacute  ECG:  Personally reviewed by me showing: HR : 77 Rhythm:  NSR    no evidence of ischemic changes QTC 417    ED Triage Vitals  Enc Vitals Group     BP 09/25/17 1505 (!) 154/84     Pulse Rate 09/25/17 1505 76     Resp 09/25/17 1505 (!) 28     Temp 09/25/17 1505 98.1 F (36.7 C)     Temp Source 09/25/17 1617 Rectal     SpO2 09/25/17 1505 100 %     Weight 09/25/17 1507 230 lb (104.3 kg)     Height 09/25/17 1507 6\' 1"  (1.854 m)     Head Circumference --      Peak Flow --      Pain Score 09/25/17 1506 6     Pain Loc --      Pain Edu? --      Excl. in GC? --   TMAX(24)@       Latest  Blood pressure 132/77, pulse (!) 101, temperature 99.4 F (37.4 C), temperature source Oral, resp. rate (!) 21, height 6\' 1"  (1.854 m), weight 99.2 kg, SpO2 100 %.  Hospitalist was called for admission for SIRS   Review of Systems:    Pertinent positives include: Fevers, chills, fatigue, chest pain, abdominal pain nausea, vomiting,  Constitutional:  No weight loss, night sweats,  weight loss  HEENT:  No headaches, Difficulty swallowing,Tooth/dental  problems,Sore throat,  No sneezing, itching, ear ache, nasal congestion, post nasal drip,  Cardio-vascular:  No  Orthopnea, PND, anasarca, dizziness, palpitations.no Bilateral lower extremity swelling  GI:  No heartburn, indigestion, , diarrhea, change in bowel habits, loss of appetite, melena, blood in stool, hematemesis Resp:  no shortness of breath at rest. No dyspnea on exertion, No excess mucus, no productive cough, No non-productive cough, No coughing up of blood.No change in color of mucus.No wheezing. Skin:  no rash or lesions. No jaundice GU:  no dysuria, change in color of urine, no urgency or frequency. No straining to urinate.  No flank pain.  Musculoskeletal:  No joint pain or no joint swelling. No decreased range of motion. No back pain.  Psych:  No change in mood or affect. No depression or anxiety. No memory loss.  Neuro: no localizing neurological complaints, no tingling, no weakness, no double vision, no gait abnormality, no slurred speech, no confusion  All systems reviewed and apart from HOPI all are negative  Past Medical History:  History reviewed. No pertinent past medical history.    History reviewed. No pertinent surgical history.  Social History:  Ambulatory  independently      reports that he has never smoked. He has never used smokeless tobacco. He reports that he does not drink alcohol or use drugs.     Family History:   Family History  Problem Relation Age of Onset  . Diabetes Mother   . Hypertension Mother   . CAD Neg Hx   . Cancer Neg Hx   . Stroke Neg Hx     Allergies: No Known Allergies   Prior to  Admission medications   Medication Sig Start Date End Date Taking? Authorizing Provider  ondansetron (ZOFRAN ODT) 4 MG disintegrating tablet Take 1 tablet (4 mg total) by mouth every 8 (eight) hours as needed for nausea or vomiting. 09/24/17   Tilden Fossa, MD   Physical Exam: Blood pressure 132/77, pulse (!) 101, temperature 99.4 F  (37.4 C), temperature source Oral, resp. rate (!) 21, height 6\' 1"  (1.854 m), weight 99.2 kg, SpO2 100 %. 1. General:  in No Acute distress  well  -appearing 2. Psychological: Alert and   Oriented 3. Head/ENT:     Dry Mucous Membranes                          Head Non traumatic, neck supple                            Poor Dentition 4. SKIN:  decreased Skin turgor,  Skin clean Dry and intact no rash 5. Heart: Regular rate and rhythm no Murmur, no Rub or gallop 6. Lungs no wheezes or crackles   7. Abdomen: Soft, non-tender, Non distended   Obese bowel sounds present 8. Lower extremities: no clubbing, cyanosis, or  edema 9. Neurologically Grossly intact, moving all 4 extremities equally   10. MSK: Normal range of motion   LABS:     Recent Labs  Lab 09/24/17 1124 09/25/17 1537  WBC 11.2* 8.2  NEUTROABS  --  5.3  HGB 15.9 15.6  HCT 44.1 43.5  MCV 89.6 91.2  PLT 245 232   Basic Metabolic Panel: Recent Labs  Lab 09/24/17 1124 09/25/17 1539  NA 139 141  K 3.9 3.6  CL 103 103  CO2 24 25  GLUCOSE 136* 125*  BUN 10 16  CREATININE 1.20 1.40*  CALCIUM 9.9 9.2      Recent Labs  Lab 09/24/17 1124 09/25/17 1539  AST 34 35  ALT 27 26  ALKPHOS 67 64  BILITOT 1.0 0.9  PROT 8.2* 7.9  ALBUMIN 4.6 4.5   Recent Labs  Lab 09/24/17 1124 09/25/17 1539  LIPASE 33 38   No results for input(s): AMMONIA in the last 168 hours.    HbA1C: No results for input(s): HGBA1C in the last 72 hours. CBG: Recent Labs  Lab 09/25/17 1543  GLUCAP 95      Urine analysis:    Component Value Date/Time   COLORURINE YELLOW 09/25/2017 1730   APPEARANCEUR CLOUDY (A) 09/25/2017 1730   LABSPEC 1.015 09/25/2017 1730   PHURINE 8.5 (H) 09/25/2017 1730   GLUCOSEU NEGATIVE 09/25/2017 1730   HGBUR TRACE (A) 09/25/2017 1730   BILIRUBINUR NEGATIVE 09/25/2017 1730   KETONESUR 15 (A) 09/25/2017 1730   PROTEINUR NEGATIVE 09/25/2017 1730   NITRITE NEGATIVE 09/25/2017 1730   LEUKOCYTESUR  NEGATIVE 09/25/2017 1730       Cultures: No results found for: SDES, SPECREQUEST, CULT, REPTSTATUS   Radiological Exams on Admission: Dg Chest 2 View  Result Date: 09/25/2017 CLINICAL DATA:  Chest tightness and vomiting for the past 3 days. EXAM: CHEST - 2 VIEW COMPARISON:  Chest radiographs and chest CTA obtained yesterday. FINDINGS: The heart size and mediastinal contours are within normal limits. Both lungs are clear. The visualized skeletal structures are unremarkable. IMPRESSION: Normal examination. Electronically Signed   By: Beckie Salts M.D.   On: 09/25/2017 16:54   Dg Chest 2 View  Result Date: 09/24/2017 CLINICAL DATA:  Chest pain EXAM: CHEST - 2 VIEW COMPARISON:  None. FINDINGS: Lungs are clear. Heart size and pulmonary vascularity are normal. No adenopathy. No pneumothorax. No bone lesions. IMPRESSION: No edema or consolidation. Electronically Signed   By: Bretta BangWilliam  Woodruff III M.D.   On: 09/24/2017 12:19   Dg Abdomen 1 View  Result Date: 09/25/2017 CLINICAL DATA:  Chest tightness and vomiting for the past 3 days. EXAM: ABDOMEN - 1 VIEW COMPARISON:  Gallbladder ultrasound dated 12/06/2003. FINDINGS: Normal bowel gas pattern. Multiple pelvic phleboliths. Normal appearing bones. IMPRESSION: No acute abnormality. Electronically Signed   By: Beckie SaltsSteven  Reid M.D.   On: 09/25/2017 16:55   Ct Abdomen Pelvis W Contrast  Result Date: 09/25/2017 CLINICAL DATA:  Abdominal pain. EXAM: CT ABDOMEN AND PELVIS WITH CONTRAST TECHNIQUE: Multidetector CT imaging of the abdomen and pelvis was performed using the standard protocol following bolus administration of intravenous contrast. CONTRAST:  100mL ISOVUE-300 IOPAMIDOL (ISOVUE-300) INJECTION 61% COMPARISON:  None FINDINGS: Lower chest: No acute abnormality. Hepatobiliary: 7 mm low-density structure within left lobe of liver is too small to characterize. No suspicious liver abnormalities noted. Gallbladder appears normal. No biliary dilatation.  Pancreas: Unremarkable. No pancreatic ductal dilatation or surrounding inflammatory changes. Spleen: Small low density structure within the spleen measures 6 mm, image 13/2. Likely benign. Spleen otherwise unremarkable. Adrenals/Urinary Tract: The adrenal glands are unremarkable. No kidney mass or hydronephrosis. The urinary bladder is unremarkable. Stomach/Bowel: The stomach is normal. Small bowel loops have a normal course and caliber. No wall thickening or inflammation identified. The appendix is visualized and appears within normal limits. No pathologic dilatation of the colon. Vascular/Lymphatic: Normal appearance of the abdominal aorta. No enlarged retroperitoneal or mesenteric adenopathy. No enlarged pelvic or inguinal lymph nodes. Reproductive: Prostate is unremarkable. Other: No abdominal wall hernia or abnormality. No abdominopelvic ascites. Musculoskeletal: Degenerative disc disease identified at L4-5 and L5-S1. No the or significant osseous findings. IMPRESSION: No acute findings within the abdomen or pelvis. No explanation for patient's abdominal pain. Electronically Signed   By: Signa Kellaylor  Stroud M.D.   On: 09/25/2017 20:00   Ct Angio Chest/abd/pel For Dissection W And/or W/wo  Result Date: 09/24/2017 CLINICAL DATA:  Vomiting. Chest pain and shortness of breath. Evaluate for thoracic aortic dissection EXAM: CT ANGIOGRAPHY CHEST, ABDOMEN AND PELVIS TECHNIQUE: Multidetector CT imaging through the chest, abdomen and pelvis was performed using the standard protocol during bolus administration of intravenous contrast. Multiplanar reconstructed images and MIPs were obtained and reviewed to evaluate the vascular anatomy. CONTRAST:  100mL ISOVUE-370 IOPAMIDOL (ISOVUE-370) INJECTION 76% COMPARISON:  None. FINDINGS: CTA CHEST FINDINGS Vascular Findings: Review of the precontrast images are negative for the presence of an intramural hematoma. No evidence of thoracic aortic aneurysm with measurements as follows.  No evidence of thoracic aortic dissection or periaortic stranding on this nongated examination. Bovine configuration of the aortic arch. The branch vessels of the aortic arch appear widely patent throughout their imaged course. Normal heart size.  No pericardial effusion. Although this examination was not tailored for the evaluation the pulmonary arteries, there are no discrete filling defects within the central pulmonary arterial tree to suggest central pulmonary embolism. Normal caliber of the main pulmonary artery. ------------------------------------------------------------- Thoracic aortic measurements: Sinotubular junction 31 mm as measured in greatest oblique coronal dimension. Proximal ascending aorta 30 mm as measured in greatest oblique axial dimension at the level of the main pulmonary artery. Aortic arch aorta 23 mm as measured in greatest oblique sagittal dimension. Proximal descending thoracic aorta 21 mm as  measured in greatest oblique axial dimension at the level of the main pulmonary artery. Distal descending thoracic aorta 23 mm as measured in greatest oblique axial dimension at the level of the diaphragmatic hiatus. Review of the MIP images confirms the above findings. ------------------------------------------------------------- Non-Vascular Findings: Mediastinum/Lymph Nodes: No bulky mediastinal, hilar axillary lymphadenopathy. There is a minimal amount of ill-defined soft tissue within the anterior mediastinum which is favored to represent residual thymic tissue. No associated mass effect. Lungs/Pleura: No focal airspace opacities. No pleural effusion or pneumothorax. The central pulmonary airways appear widely patent. No discrete pulmonary nodules. Musculoskeletal: Regional soft tissues appear normal. No radiopaque foreign body. Moderate DDD of C6-C7 with disc space height loss, endplate irregularity and sclerosis. _________________________________________________________  _________________________________________________________ CTA ABDOMEN AND PELVIS FINDINGS VASCULAR Aorta: Normal caliber of the abdominal aorta. No significant atherosclerotic plaque. No abdominal aortic dissection or periaortic stranding. Celiac: Widely patent without hemodynamically significant narrowing. Conventional branching pattern. SMA: Widely patent without hemodynamically significant narrowing. Conventional branching pattern the distal tributaries the SMA appear widely patent without discrete intraluminal filling defect to suggest distal embolism. Renals: The bilateral renal arteries appear widely patent. No vessel irregularity to suggest FMD. IMA: Widely patent. Inflow: The bilateral common, external and internal iliac arteries are of normal caliber and widely patent without hemodynamically significant stenosis. Veins: The IVC and pelvic venous system appear widely patent on this arterial phase examination. Review of the MIP images confirms the above findings. NON-VASCULAR Evaluation the abdominal organs is limited to the arterial phase of enhancement. Hepatobiliary: Normal hepatic contour. Punctate (approximately 0.6 cm) hypoattenuating lesion within the left lobe of the liver is too small to adequately characterize though favored to represent a hepatic cyst. No discrete hyperenhancing hepatic lesions. Normal appearance of the gallbladder given degree distention. No radiopaque gallstones. No intra extrahepatic bili duct dilatation. No ascites. Pancreas: Normal appearance of the pancreas Spleen: Normal appearance of the spleen. Adrenals/Urinary Tract: There is symmetric enhancement of the bilateral kidneys. No definite renal stones this postcontrast examination. No discrete renal lesions. No urine obstruction or perinephric stranding. Normal appearance the bilateral adrenal glands. Normal appearance of the urinary bladder given degree distention. Stomach/Bowel: The bowel is normal in course and caliber  without wall thickening or evidence of enteric obstruction. Normal appearance of the terminal ileum and appendix. No pneumoperitoneum, pneumatosis or portal venous gas. Lymphatic: No bulky retroperitoneal, mesenteric, pelvic or inguinal lymphadenopathy. Reproductive: Borderline enlarged prostate gland. Multiple phleboliths are seen with the lower pelvis bilaterally, left greater than right. Note is made of a small left-sided suspected varicocele (representative image 195, series 6). Other: Regional soft tissues appear normal. Musculoskeletal: No acute or aggressive osseous abnormalities. 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 Review of the MIP images confirms the above findings. IMPRESSION: 1. No acute cardiopulmonary disease. Specifically, no evidence of thoracic aortic aneurysm, dissection or central pulmonary embolism. 2. No acute findings within the abdomen or pelvis. Electronically Signed   By: Simonne Come M.D.   On: 09/24/2017 13:46    Chart has been reviewed    Assessment/Plan  47 y.o. male with no significant medical history   Admitted for SIRS due to Gastroenteritis  Present on Admission: . SIRS (systemic inflammatory response syndrome) (HCC) unclear source for now continue vancomycin and Zosyn given the patient appears to be toxic at presentation await results of blood cultures procalcitonin MRSA screen Clear source no evidence of urinary tract infection both CT  chest and CT abdomen unremarkable U tox positive for opiates but patient was given morphine in the ER . Dehydration we will rehydrate and monitor . AKI (acute kidney injury) (HCC) - - likely secondary to dehydration, check FeNA and if not improved with IVF would obtain renal US   . Nausea vomiting and diarrhea suspect gastroenteritis CT abdomen unremarkable obtain gastric panel if reccures but now seems resolved . Chest pain currently  resolved in the setting of significant vomiting CT a no PE or dissection Given CP today will obtain trop to finish cycle and monitor on Tele. Doubt ACS  Other plan as per orders.  DVT prophylaxis:   Lovenox     Code Status:  FULL CODE as per patient    I had personally discussed CODE STATUS with patient and family   Family Communication:   Family  at  Bedside  plan of care was discussed with   mother  Disposition Plan:                                    Consults called: none  Admission status:    obs   Level of care     SDU      Cannen Dupras 09/25/2017, 11:46 PM    [facility]  Pager [phone]   after 2 AM please page floor coverage PA If 7AM-7PM, please contact the day team taking care of the patient  [email or web address]  Password [email or web address]

## 2017-09-25 NOTE — ED Notes (Signed)
Report Attempted X 1

## 2017-09-26 DIAGNOSIS — R197 Diarrhea, unspecified: Secondary | ICD-10-CM

## 2017-09-26 DIAGNOSIS — R0789 Other chest pain: Secondary | ICD-10-CM

## 2017-09-26 DIAGNOSIS — R112 Nausea with vomiting, unspecified: Secondary | ICD-10-CM

## 2017-09-26 DIAGNOSIS — E860 Dehydration: Secondary | ICD-10-CM

## 2017-09-26 DIAGNOSIS — N179 Acute kidney failure, unspecified: Secondary | ICD-10-CM

## 2017-09-26 LAB — MAGNESIUM: Magnesium: 1.7 mg/dL (ref 1.7–2.4)

## 2017-09-26 LAB — CBC
HEMATOCRIT: 38.2 % — AB (ref 39.0–52.0)
HEMOGLOBIN: 13.1 g/dL (ref 13.0–17.0)
MCH: 31.8 pg (ref 26.0–34.0)
MCHC: 34.3 g/dL (ref 30.0–36.0)
MCV: 92.7 fL (ref 78.0–100.0)
Platelets: 219 10*3/uL (ref 150–400)
RBC: 4.12 MIL/uL — AB (ref 4.22–5.81)
RDW: 12.6 % (ref 11.5–15.5)
WBC: 7.7 10*3/uL (ref 4.0–10.5)

## 2017-09-26 LAB — COMPREHENSIVE METABOLIC PANEL
ALBUMIN: 3.3 g/dL — AB (ref 3.5–5.0)
ALK PHOS: 49 U/L (ref 38–126)
ALT: 21 U/L (ref 0–44)
ANION GAP: 10 (ref 5–15)
AST: 22 U/L (ref 15–41)
BUN: 9 mg/dL (ref 6–20)
CALCIUM: 8.4 mg/dL — AB (ref 8.9–10.3)
CHLORIDE: 108 mmol/L (ref 98–111)
CO2: 24 mmol/L (ref 22–32)
Creatinine, Ser: 1.16 mg/dL (ref 0.61–1.24)
GFR calc non Af Amer: >60 mL/min (ref >60)
Glucose, Bld: 111 mg/dL — ABNORMAL HIGH (ref 70–99)
POTASSIUM: 3.6 mmol/L (ref 3.5–5.1)
Sodium: 142 mmol/L (ref 135–145)
Total Bilirubin: 0.9 mg/dL (ref 0.3–1.2)
Total Protein: 6.1 g/dL — ABNORMAL LOW (ref 6.5–8.1)

## 2017-09-26 LAB — CREATININE, URINE, RANDOM: CREATININE, URINE: 116.82 mg/dL

## 2017-09-26 LAB — HIV ANTIBODY (ROUTINE TESTING W REFLEX): HIV SCREEN 4TH GENERATION: NONREACTIVE

## 2017-09-26 LAB — PROCALCITONIN: Procalcitonin: <0.1 ng/mL

## 2017-09-26 LAB — SODIUM, URINE, RANDOM: Sodium, Ur: 196 mmol/L

## 2017-09-26 LAB — LACTIC ACID, PLASMA: Lactic Acid, Venous: 1.9 mmol/L (ref 0.5–1.9)

## 2017-09-26 LAB — TSH: TSH: 0.549 u[IU]/mL (ref 0.350–4.500)

## 2017-09-26 LAB — TROPONIN I
Troponin I: <0.03 ng/mL (ref <0.03)
Troponin I: <0.03 ng/mL (ref <0.03)

## 2017-09-26 LAB — PHOSPHORUS: PHOSPHORUS: 3.7 mg/dL (ref 2.5–4.6)

## 2017-09-26 NOTE — Progress Notes (Signed)
Reviewed discharge instructions with pt and his mom. Reviewed medications and setting up appt with PCP. Pt verbalizes understanding. Removed piv's and pt will be discharged in wheelchair

## 2017-09-26 NOTE — Discharge Summary (Signed)
Discharge Summary  Patrick Meyer ZDG:[id number] DOB: [birth year]  PCP: Patient, No Pcp Per  Admit date: 09/25/2017 Discharge date: 09/26/2017  Time spent: 30 mins  Recommendations for Outpatient Follow-up:  1. PCP- Encouraged to establish care  Discharge Diagnoses:  Active Hospital Problems   Diagnosis Date Noted  . SIRS (systemic inflammatory response syndrome) (HCC) 09/25/2017  . Dehydration 09/25/2017  . AKI (acute kidney injury) (HCC) 09/25/2017  . Nausea vomiting and diarrhea 09/25/2017  . Chest pain 09/25/2017    Resolved Hospital Problems  No resolved problems to display.    Discharge Condition: Stable  Diet recommendation: Regular   Vitals:   09/26/17 0700 09/26/17 0800  BP: 137/81 (!) 147/88  Pulse: 65 65  Resp: 17 16  Temp:  98.7 F (37.1 C)  SpO2: 100% 100%    History of present illness:  47 year old male with no significant past medical history presented to the ED with 2 days of nausea and vomiting, and some chest discomfort shortly after profuse vomiting.  Patient reported he had eaten a sandwich from [place] [place] prior to the episode of nausea and vomiting.  Patient denied any diarrhea, fever/chills, abdominal pain.  Patient was seen the day prior in the ER due to similar episode, his CTA chest was done at that time which showed no evidence of PE, troponins negative.  In the ED, lactic acid was elevated, CT abdomen showed no evidence of infectious process, chest x-ray unremarkable.  Patient was aggressively hydrated, and started on Vanco and Zosyn and admitted to SDU for further management.  Today, patient reported feeling much better, denies any further nausea/vomiting, denies any abdominal pain, chest pain, cough, shortness of breath, diarrhea, fever/chills.  Patient very eager to be discharged.  Hospital Course:  Active Problems:   SIRS (systemic inflammatory response syndrome) (HCC)   Dehydration   AKI (acute kidney injury) (HCC)   Nausea vomiting and  diarrhea   Chest pain  Acute gastroenteritis, likely viral Afebrile, no leukocytosis, currently asymptomatic Lactic acidosis resolved Procalcitonin negative BC X 2 preliminary report, no growth so far, will follow CT abdomen/pelvis unremarkable No further antibiotics needed, may precipitate diarrhea Encouraged patient to establish care with PCP at [facility]  AKI Resolved status post IV fluid  Noncardiac chest pain/discmfort Resolved Likely due to significant nausea vomiting Trop x3-, EKG with no acute ST changes CTA chest unremarkable  Procedures:  None  Consultations:  None  Discharge Exam: BP (!) 147/88 (BP Location: Right Arm)   Pulse 65   Temp 98.7 F (37.1 C) (Oral)   Resp 16   Ht 6\' 1"  (1.854 m)   Wt 99.2 kg   SpO2 100%   BMI 28.85 kg/m   General: NAD Cardiovascular: S1, S2 present Respiratory: CTAB  Discharge Instructions You were cared for by a hospitalist during your hospital stay. If you have any questions about your discharge medications or the care you received while you were in the hospital after you are discharged, you can call the unit and asked to speak with the hospitalist on call if the hospitalist that took care of you is not available. Once you are discharged, your primary care physician will handle any further medical issues. Please note that NO REFILLS for any discharge medications will be authorized once you are discharged, as it is imperative that you return to your primary care physician (or establish a relationship with a primary care physician if you do not have one) for your aftercare needs so that they  can reassess your need for medications and monitor your lab values.   Allergies as of 09/26/2017   No Known Allergies     Medication List    TAKE these medications   ibuprofen 200 MG tablet Commonly known as:  ADVIL,MOTRIN Take 400 mg by mouth every 6 (six) hours as needed for moderate pain.   multivitamin with minerals  Tabs tablet Take 1 tablet by mouth daily.   ondansetron 4 MG disintegrating tablet Commonly known as:  ZOFRAN-ODT Take 1 tablet (4 mg total) by mouth every 8 (eight) hours as needed for nausea or vomiting.   ranitidine 150 MG tablet Commonly known as:  ZANTAC Take 150 mg by mouth 2 (two) times daily as needed for heartburn.      No Known Allergies Follow-up Information     [facility]. Schedule an appointment as soon as possible for a visit in 1 week(s).   Contact information: [address] [place] [address] [phone]           The results of significant diagnostics from this hospitalization (including imaging, microbiology, ancillary and laboratory) are listed below for reference.    Significant Diagnostic Studies: Dg Chest 2 View  Result Date: 09/25/2017 CLINICAL DATA:  Chest tightness and vomiting for the past 3 days. EXAM: CHEST - 2 VIEW COMPARISON:  Chest radiographs and chest CTA obtained yesterday. FINDINGS: The heart size and mediastinal contours are within normal limits. Both lungs are clear. The visualized skeletal structures are unremarkable. IMPRESSION: Normal examination. Electronically Signed   By: Beckie Salts M.D.   On: 09/25/2017 16:54   Dg Chest 2 View  Result Date: 09/24/2017 CLINICAL DATA:  Chest pain EXAM: CHEST - 2 VIEW COMPARISON:  None. FINDINGS: Lungs are clear. Heart size and pulmonary vascularity are normal. No adenopathy. No pneumothorax. No bone lesions. IMPRESSION: No edema or consolidation. Electronically Signed   By: Bretta Bang III M.D.   On: 09/24/2017 12:19   Dg Abdomen 1 View  Result Date: 09/25/2017 CLINICAL DATA:  Chest tightness and vomiting for the past 3 days. EXAM: ABDOMEN - 1 VIEW COMPARISON:  Gallbladder ultrasound dated 12/06/2003. FINDINGS: Normal bowel gas pattern. Multiple pelvic phleboliths. Normal appearing bones. IMPRESSION: No acute abnormality. Electronically  Signed   By: Beckie Salts M.D.   On: 09/25/2017 16:55   Ct Abdomen Pelvis W Contrast  Result Date: 09/25/2017 CLINICAL DATA:  Abdominal pain. EXAM: CT ABDOMEN AND PELVIS WITH CONTRAST TECHNIQUE: Multidetector CT imaging of the abdomen and pelvis was performed using the standard protocol following bolus administration of intravenous contrast. CONTRAST:  ISOVUE-300 IOPAMIDOL (ISOVUE-300) INJECTION 61% COMPARISON:  None FINDINGS: Lower chest: No acute abnormality. Hepatobiliary: 7 mm low-density structure within left lobe of liver is too small to characterize. No suspicious liver abnormalities noted. Gallbladder appears normal. No biliary dilatation. Pancreas: Unremarkable. No pancreatic ductal dilatation or surrounding inflammatory changes. Spleen: Small low density structure within the spleen measures 6 mm, image 13/2. Likely benign. Spleen otherwise unremarkable. Adrenals/Urinary Tract: The adrenal glands are unremarkable. No kidney mass or hydronephrosis. The urinary bladder is unremarkable. Stomach/Bowel: The stomach is normal. Small bowel loops have a normal course and caliber. No wall thickening or inflammation identified. The appendix is visualized and appears within normal limits. No pathologic dilatation of the colon. Vascular/Lymphatic: Normal appearance of the abdominal aorta. No enlarged retroperitoneal or mesenteric adenopathy. No enlarged pelvic or inguinal lymph nodes. Reproductive: Prostate is unremarkable. Other: No abdominal wall hernia or  abnormality. No abdominopelvic ascites. Musculoskeletal: Degenerative disc disease identified at L4-5 and L5-S1. No the or significant osseous findings. IMPRESSION: No acute findings within the abdomen or pelvis. No explanation for patient's abdominal pain. Electronically Signed   By: Signa Kell M.D.   On: 09/25/2017 20:00   Ct Angio Chest/abd/pel For Dissection W And/or W/wo  Result Date: 09/24/2017 CLINICAL DATA:  Vomiting. Chest pain and  shortness of breath. Evaluate for thoracic aortic dissection EXAM: CT ANGIOGRAPHY CHEST, ABDOMEN AND PELVIS TECHNIQUE: Multidetector CT imaging through the chest, abdomen and pelvis was performed using the standard protocol during bolus administration of intravenous contrast. Multiplanar reconstructed images and MIPs were obtained and reviewed to evaluate the vascular anatomy. CONTRAST:  ISOVUE-370 IOPAMIDOL (ISOVUE-370) INJECTION 76% COMPARISON:  None. FINDINGS: CTA CHEST FINDINGS Vascular Findings: Review of the precontrast images are negative for the presence of an intramural hematoma. No evidence of thoracic aortic aneurysm with measurements as follows. No evidence of thoracic aortic dissection or periaortic stranding on this nongated examination. Bovine configuration of the aortic arch. The branch vessels of the aortic arch appear widely patent throughout their imaged course. Normal heart size.  No pericardial effusion. Although this examination was not tailored for the evaluation the pulmonary arteries, there are no discrete filling defects within the central pulmonary arterial tree to suggest central pulmonary embolism. Normal caliber of the main pulmonary artery. ------------------------------------------------------------- Thoracic aortic measurements: Sinotubular junction 31 mm as measured in greatest oblique coronal dimension. Proximal ascending aorta 30 mm as measured in greatest oblique axial dimension at the level of the main pulmonary artery. Aortic arch aorta 23 mm as measured in greatest oblique sagittal dimension. Proximal descending thoracic aorta 21 mm as measured in greatest oblique axial dimension at the level of the main pulmonary artery. Distal descending thoracic aorta 23 mm as measured in greatest oblique axial dimension at the level of the diaphragmatic hiatus. Review of the MIP images confirms the above findings. -------------------------------------------------------------  Non-Vascular Findings: Mediastinum/Lymph Nodes: No bulky mediastinal, hilar axillary lymphadenopathy. There is a minimal amount of ill-defined soft tissue within the anterior mediastinum which is favored to represent residual thymic tissue. No associated mass effect. Lungs/Pleura: No focal airspace opacities. No pleural effusion or pneumothorax. The central pulmonary airways appear widely patent. No discrete pulmonary nodules. Musculoskeletal: Regional soft tissues appear normal. No radiopaque foreign body. Moderate DDD of C6-C7 with disc space height loss, endplate irregularity and sclerosis. _________________________________________________________ _________________________________________________________ CTA ABDOMEN AND PELVIS FINDINGS VASCULAR Aorta: Normal caliber of the abdominal aorta. No significant atherosclerotic plaque. No abdominal aortic dissection or periaortic stranding. Celiac: Widely patent without hemodynamically significant narrowing. Conventional branching pattern. SMA: Widely patent without hemodynamically significant narrowing. Conventional branching pattern the distal tributaries the SMA appear widely patent without discrete intraluminal filling defect to suggest distal embolism. Renals: The bilateral renal arteries appear widely patent. No vessel irregularity to suggest FMD. IMA: Widely patent. Inflow: The bilateral common, external and internal iliac arteries are of normal caliber and widely patent without hemodynamically significant stenosis. Veins: The IVC and pelvic venous system appear widely patent on this arterial phase examination. Review of the MIP images confirms the above findings. NON-VASCULAR Evaluation the abdominal organs is limited to the arterial phase of enhancement. Hepatobiliary: Normal hepatic contour. Punctate (approximately 0.6 cm) hypoattenuating lesion within the left lobe of the liver is too small to adequately characterize though favored to represent a hepatic cyst.  No discrete hyperenhancing hepatic lesions. Normal appearance of the gallbladder given degree distention. No radiopaque gallstones.  No intra extrahepatic bili duct dilatation. No ascites. Pancreas: Normal appearance of the pancreas Spleen: Normal appearance of the spleen. Adrenals/Urinary Tract: There is symmetric enhancement of the bilateral kidneys. No definite renal stones this postcontrast examination. No discrete renal lesions. No urine obstruction or perinephric stranding. Normal appearance the bilateral adrenal glands. Normal appearance of the urinary bladder given degree distention. Stomach/Bowel: The bowel is normal in course and caliber without wall thickening or evidence of enteric obstruction. Normal appearance of the terminal ileum and appendix. No pneumoperitoneum, pneumatosis or portal venous gas. Lymphatic: No bulky retroperitoneal, mesenteric, pelvic or inguinal lymphadenopathy. Reproductive: Borderline enlarged prostate gland. Multiple phleboliths are seen with the lower pelvis bilaterally, left greater than right. Note is made of a small left-sided suspected varicocele (representative image 195, series 6). Other: Regional soft tissues appear normal. Musculoskeletal: No acute or aggressive osseous abnormalities. 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 Review of the MIP images confirms the above findings. IMPRESSION: 1. No acute cardiopulmonary disease. Specifically, no evidence of thoracic aortic aneurysm, dissection or central pulmonary embolism. 2. No acute findings within the abdomen or pelvis. Electronically Signed   By: Simonne Come M.D.   On: 09/24/2017 13:46    Microbiology: Recent Results (from the past 240 hour(s))  MRSA PCR Screening     Status: None   Collection Time: 09/25/17 10:12 PM  Result Value Ref Range Status   MRSA by PCR NEGATIVE NEGATIVE Final    Comment:        The  GeneXpert MRSA Assay (FDA approved for NASAL specimens only), is one component of a comprehensive MRSA colonization surveillance program. It is not intended to diagnose MRSA infection nor to guide or monitor treatment for MRSA infections. Performed at [facility], [address], [place], [place] [address]      Labs: Basic Metabolic Panel: Recent Labs  Lab 09/24/17 1124 09/25/17 1539 09/26/17 0558  NA 139 141 142  K 3.9 3.6 3.6  CL 103 103 108  CO2 24 25 24   GLUCOSE 136* 125* 111*  BUN 10 16 9   CREATININE 1.20 1.40* 1.16  CALCIUM 9.9 9.2 8.4*  MG  --   --  1.7  PHOS  --   --  3.7   Liver Function Tests: Recent Labs  Lab 09/24/17 1124 09/25/17 1539 09/26/17 0558  AST 34 35 22  ALT 27 26 21   ALKPHOS 67 64 49  BILITOT 1.0 0.9 0.9  PROT 8.2* 7.9 6.1*  ALBUMIN 4.6 4.5 3.3*   Recent Labs  Lab 09/24/17 1124 09/25/17 1539  LIPASE 33 38   No results for input(s): AMMONIA in the last 168 hours. CBC: Recent Labs  Lab 09/24/17 1124 09/25/17 1537 09/26/17 0558  WBC 11.2* 8.2 7.7  NEUTROABS  --  5.3  --   HGB 15.9 15.6 13.1  HCT 44.1 43.5 38.2*  MCV 89.6 91.2 92.7  PLT 245 232 219   Cardiac Enzymes: Recent Labs  Lab 09/24/17 1353 09/25/17 1537 09/25/17 1827 09/26/17 0014 09/26/17 0558  CKTOTAL  --   --  486*  --   --   TROPONINI <0.03 <0.03 <0.03 <0.03 <0.03   BNP: BNP (last 3 results) No results for input(s): BNP in the last 8760 hours.  ProBNP (last 3 results) No results for input(s): PROBNP in the last 8760 hours.  CBG: Recent Labs  Lab 09/25/17 1543  GLUCAP 95  Signed:  Briant CedarNkeiruka J Reylene Stauder, MD [facility] 09/26/2017, 9:39 AM

## 2017-10-01 LAB — CULTURE, BLOOD (ROUTINE X 2)
CULTURE: NO GROWTH
Culture: NO GROWTH
SPECIAL REQUESTS: ADEQUATE
Special Requests: ADEQUATE

## 2019-05-10 IMAGING — CT CT ANGIO CHEST-ABD-PELV FOR DISSECTION W/ AND WO/W CM
2 of 9 series · 11 of 36 positions shown, 14 images · IV contrast (APPLIED)
Comparison: None.

CLINICAL DATA: Vomiting. Chest pain and shortness of breath.
Evaluate for thoracic aortic dissection

EXAM:
CT ANGIOGRAPHY CHEST, ABDOMEN AND PELVIS
TECHNIQUE: Multidetector CT imaging through the chest, abdomen and pelvis was
performed using the standard protocol during bolus administration of
intravenous contrast. Multiplanar reconstructed images and MIPs were
obtained and reviewed to evaluate the vascular anatomy.
CONTRAST:  100mL 3N2G8M-UUA IOPAMIDOL (3N2G8M-UUA) INJECTION 76%

[Series 6: axial arterial · axial · arterial · 0.98mm/px · z∈[-722,-92]mm · 10 of 240 slices shown, 13 images]
[im 15/240  mediastinal]
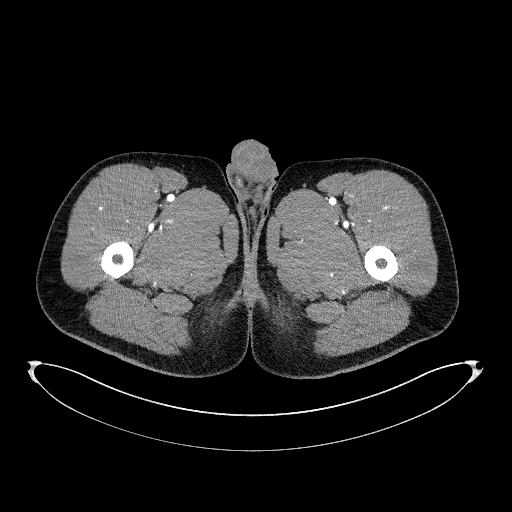
[im 15/240  bone]
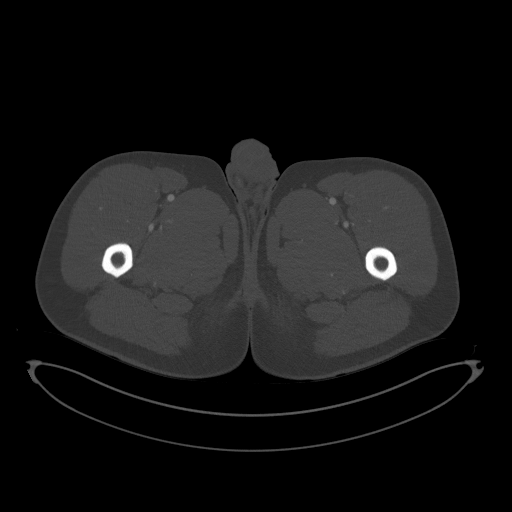
[im 45/240  mediastinal]
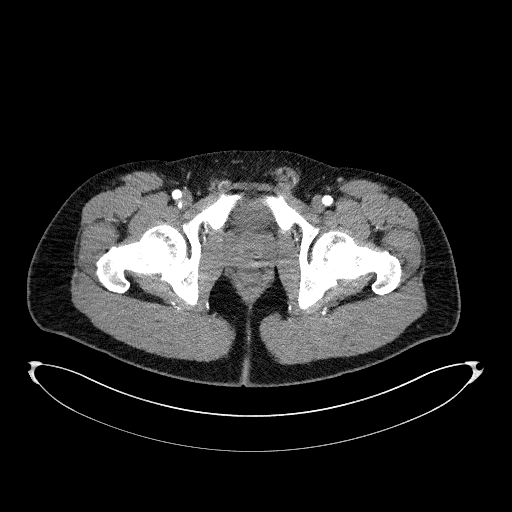
[im 75/240  mediastinal]
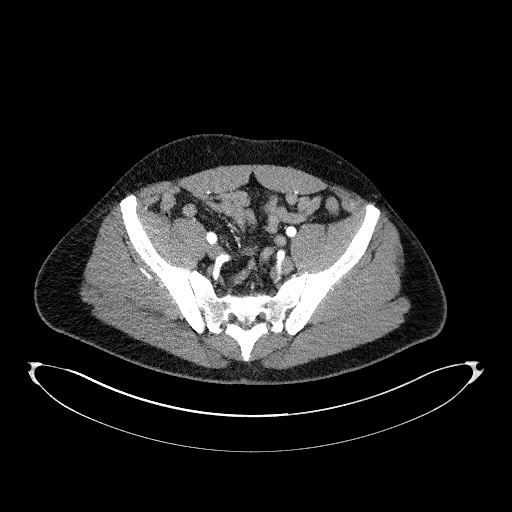
[im 105/240  mediastinal]
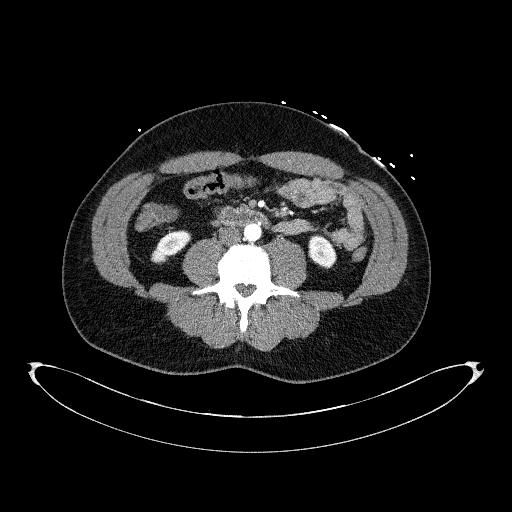
[im 135/240  mediastinal]
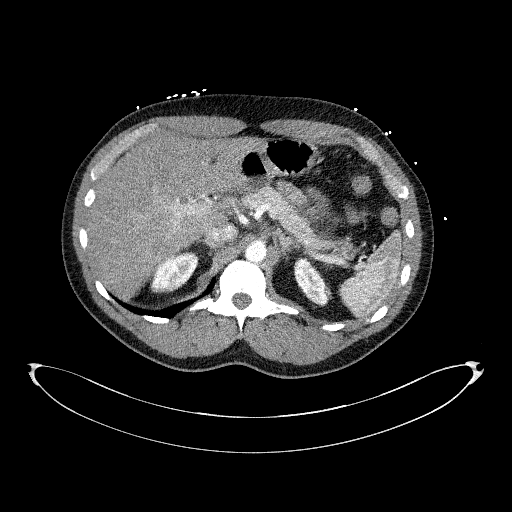
[im 165/240  mediastinal]
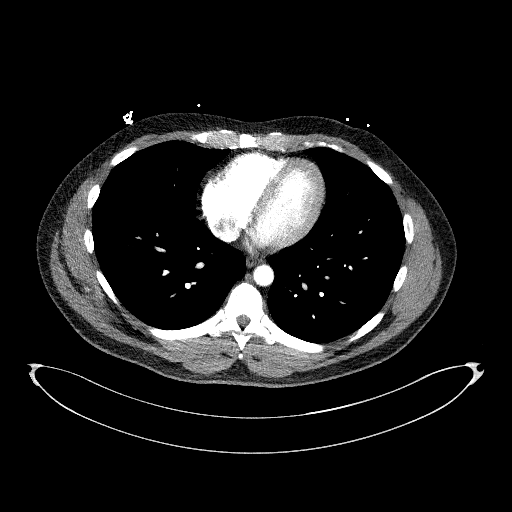
[im 180/240  lung]
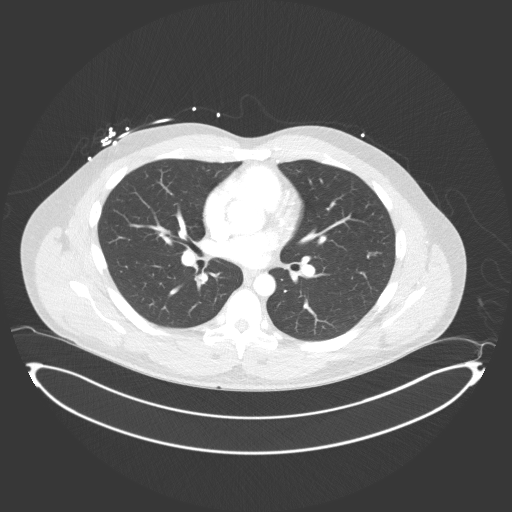
[im 195/240  mediastinal]
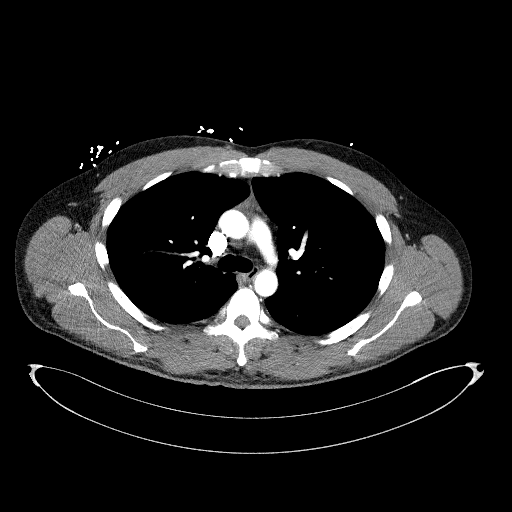
[im 195/240  lung]
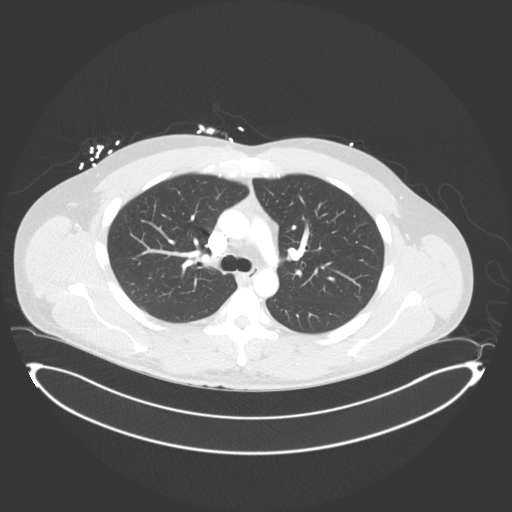
[im 210/240  lung]
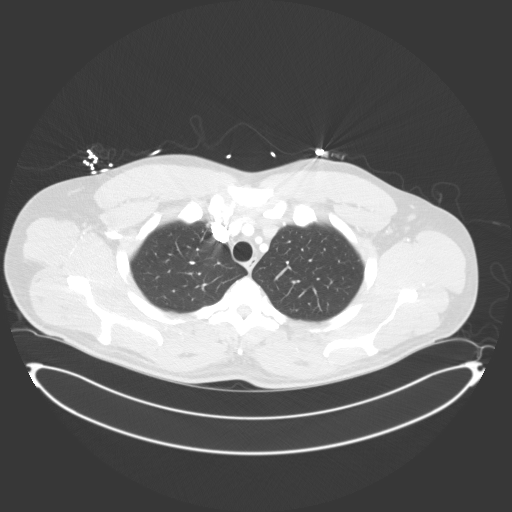
[im 225/240  mediastinal]
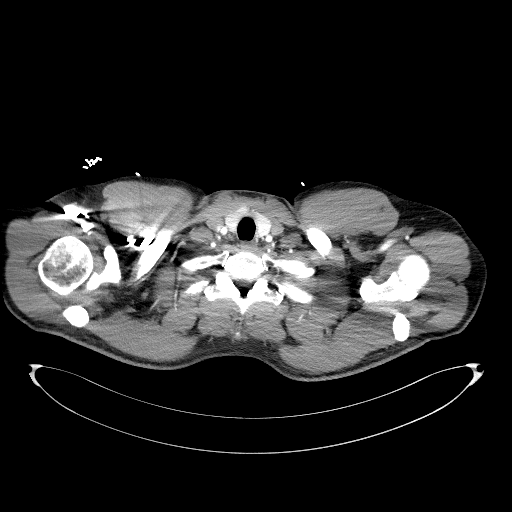
[im 225/240  lung]
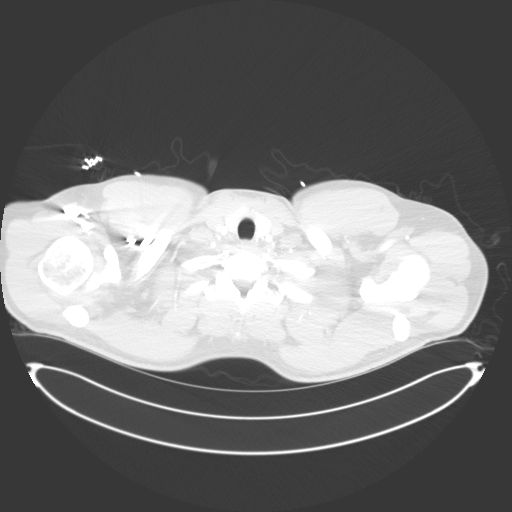

[Series 8: coronals · coronal · 0.89mm/px · 1 of 151 slices shown]
[im 76/151  mediastinal]
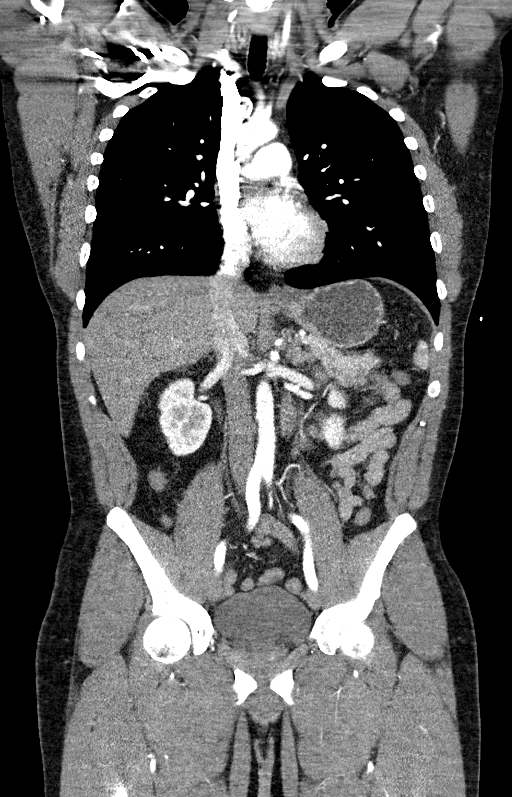

[11 of 36 positions shown; findings below may reference images not displayed]

FINDINGS: CTA CHEST FINDINGS

Vascular Findings:

Review of the precontrast images are negative for the presence of an
intramural hematoma.

No evidence of thoracic aortic aneurysm with measurements as
follows.

No evidence of thoracic aortic dissection or periaortic stranding on
this nongated examination.

Bovine configuration of the aortic arch. The branch vessels of the
aortic arch appear widely patent throughout their imaged course.

Normal heart size.  No pericardial effusion.

Although this examination was not tailored for the evaluation the
pulmonary arteries, there are no discrete filling defects within the
central pulmonary arterial tree to suggest central pulmonary
embolism. Normal caliber of the main pulmonary artery.

-------------------------------------------------------------

Thoracic aortic measurements:

Sinotubular junction

31 mm as measured in greatest oblique coronal dimension.

Proximal ascending aorta

30 mm as measured in greatest oblique axial dimension at the level
of the main pulmonary artery.

Aortic arch aorta

23 mm as measured in greatest oblique sagittal dimension.

Proximal descending thoracic aorta

21 mm as measured in greatest oblique axial dimension at the level
of the main pulmonary artery.

Distal descending thoracic aorta

23 mm as measured in greatest oblique axial dimension at the level
of the diaphragmatic hiatus.

Review of the MIP images confirms the above findings.

-------------------------------------------------------------

Non-Vascular Findings:

Mediastinum/Lymph Nodes: No bulky mediastinal, hilar axillary
lymphadenopathy. There is a minimal amount of ill-defined soft
tissue within the anterior mediastinum which is favored to represent
residual thymic tissue. No associated mass effect.

Lungs/Pleura: No focal airspace opacities. No pleural effusion or
pneumothorax. The central pulmonary airways appear widely patent.

No discrete pulmonary nodules.

Musculoskeletal: Regional soft tissues appear normal. No radiopaque
foreign body.

Moderate DDD of C6-C7 with disc space height loss, endplate
irregularity and sclerosis.

_________________________________________________________

_________________________________________________________

CTA ABDOMEN AND PELVIS FINDINGS

VASCULAR

Aorta: Normal caliber of the abdominal aorta. No significant
atherosclerotic plaque. No abdominal aortic dissection or periaortic
stranding.

Celiac: Widely patent without hemodynamically significant narrowing.
Conventional branching pattern.

SMA: Widely patent without hemodynamically significant narrowing.
Conventional branching pattern the distal tributaries the SMA appear
widely patent without discrete intraluminal filling defect to
suggest distal embolism.

Renals: The bilateral renal arteries appear widely patent. No vessel
irregularity to suggest FMD.

IMA: Widely patent.

Inflow: The bilateral common, external and internal iliac arteries
are of normal caliber and widely patent without hemodynamically
significant stenosis.

Veins: The IVC and pelvic venous system appear widely patent on this
arterial phase examination.

Review of the MIP images confirms the above findings.

NON-VASCULAR

Evaluation the abdominal organs is limited to the arterial phase of
enhancement.

Hepatobiliary: Normal hepatic contour. Punctate (approximately
cm) hypoattenuating lesion within the left lobe of the liver is too
small to adequately characterize though favored to represent a
hepatic cyst. No discrete hyperenhancing hepatic lesions. Normal
appearance of the gallbladder given degree distention. No radiopaque
gallstones. No intra extrahepatic bili duct dilatation. No ascites.

Pancreas: Normal appearance of the pancreas

Spleen: Normal appearance of the spleen.

Adrenals/Urinary Tract: There is symmetric enhancement of the
bilateral kidneys. No definite renal stones this postcontrast
examination. No discrete renal lesions. No urine obstruction or
perinephric stranding.

Normal appearance the bilateral adrenal glands. Normal appearance of
the urinary bladder given degree distention.

Stomach/Bowel: The bowel is normal in course and caliber without
wall thickening or evidence of enteric obstruction. Normal
appearance of the terminal ileum and appendix. No pneumoperitoneum,
pneumatosis or portal venous gas.

Lymphatic: No bulky retroperitoneal, mesenteric, pelvic or inguinal
lymphadenopathy.

Reproductive: Borderline enlarged prostate gland. Multiple
phleboliths are seen with the lower pelvis bilaterally, left greater
than right. Note is made of a small left-sided suspected varicocele
(representative image 195, series 6).

Other: Regional soft tissues appear normal.

Musculoskeletal: No acute or aggressive osseous abnormalities.
Moderate severe multilevel lumbar spine DDD, worse at L4-L5 and
L5-S1 with disc space height loss, endplate irregularity and
sclerosis.

Review of the MIP images confirms the above findings.
IMPRESSION: 1. No acute cardiopulmonary disease. Specifically, no evidence of
thoracic aortic aneurysm, dissection or central pulmonary embolism.
2. No acute findings within the abdomen or pelvis.

## 2019-05-11 IMAGING — CR DG CHEST 2V
2 series · 2 of 2 positions shown · non-contrast
Comparison: Chest radiographs and chest CTA obtained yesterday.

CLINICAL DATA: Chest tightness and vomiting for the past 3 days.

EXAM:
CHEST - 2 VIEW

[w chest pa]
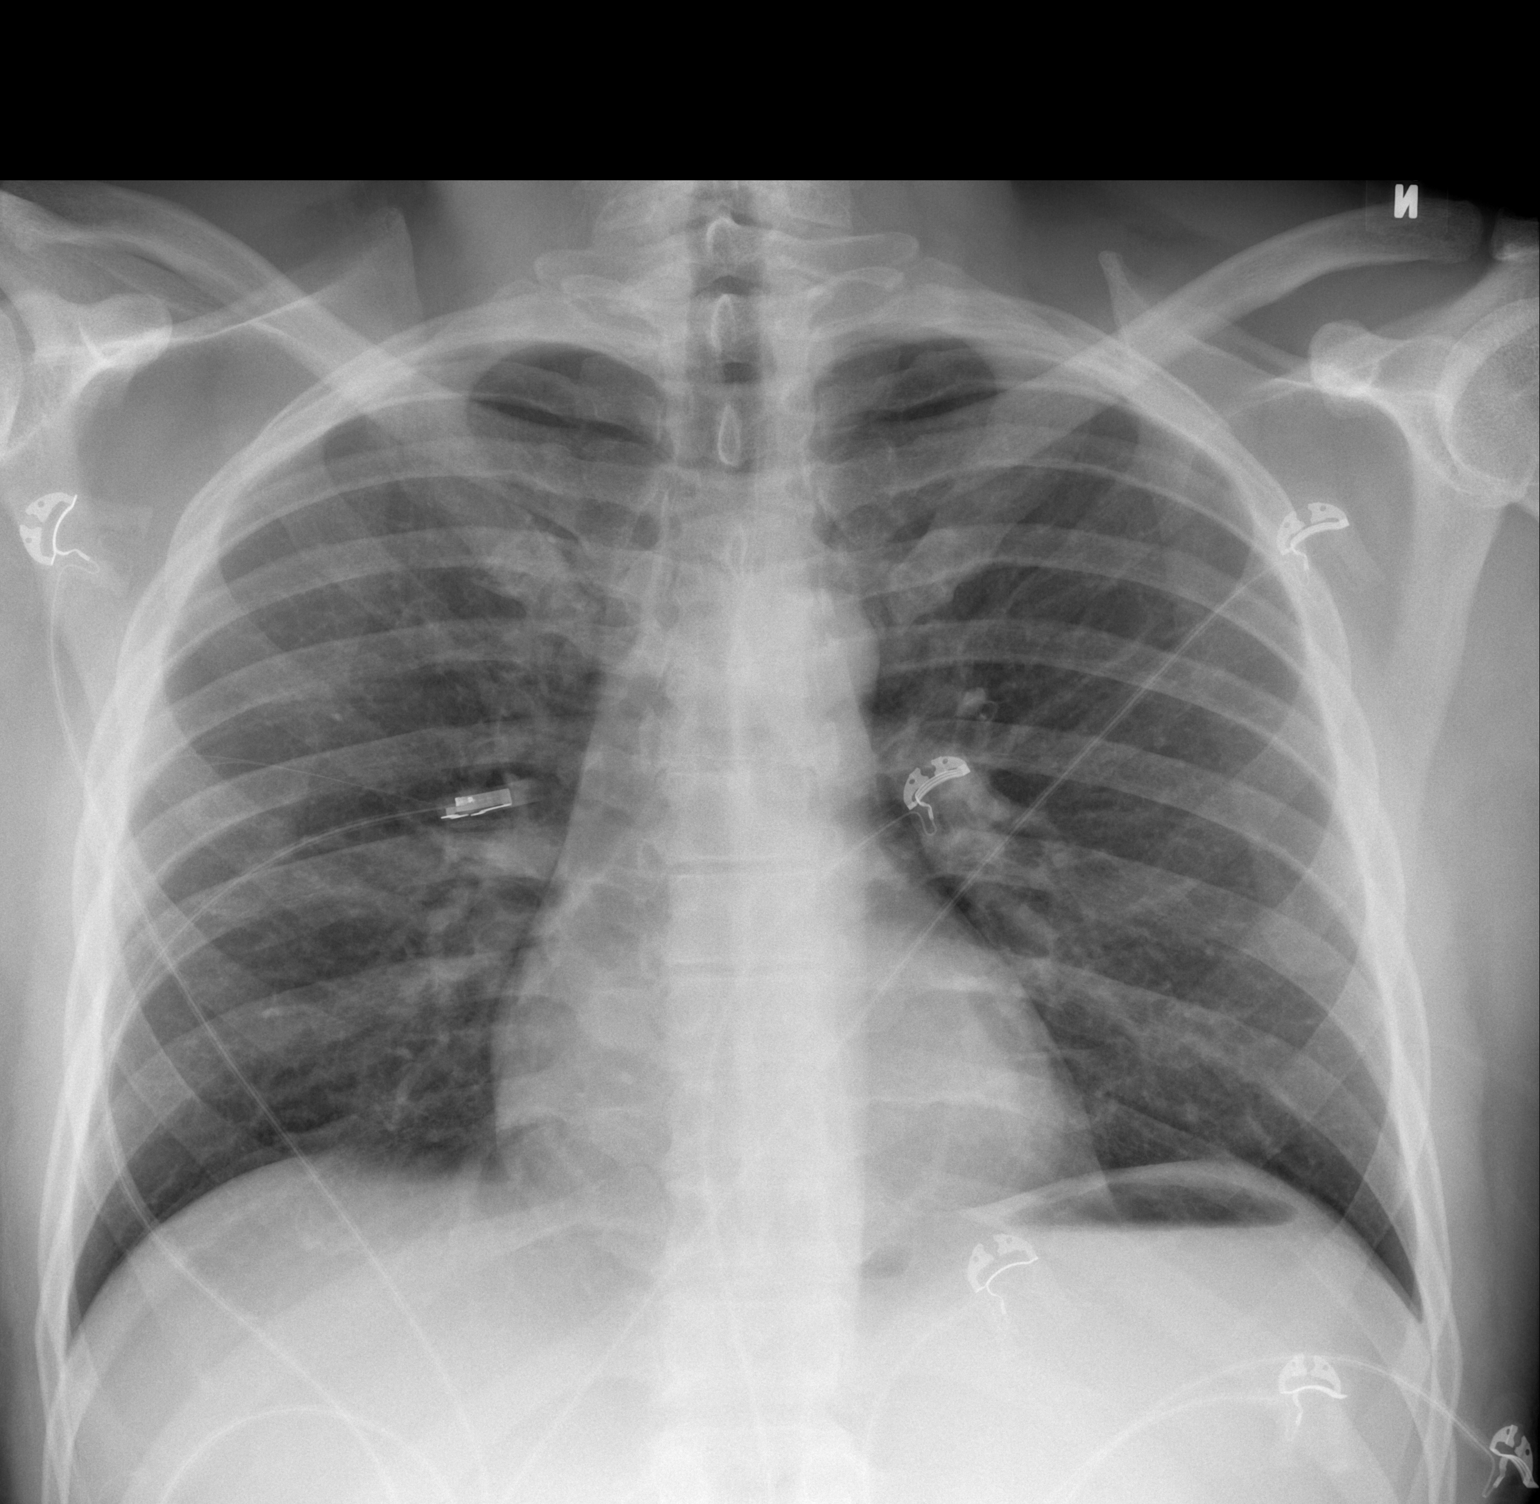

[w chest lat]
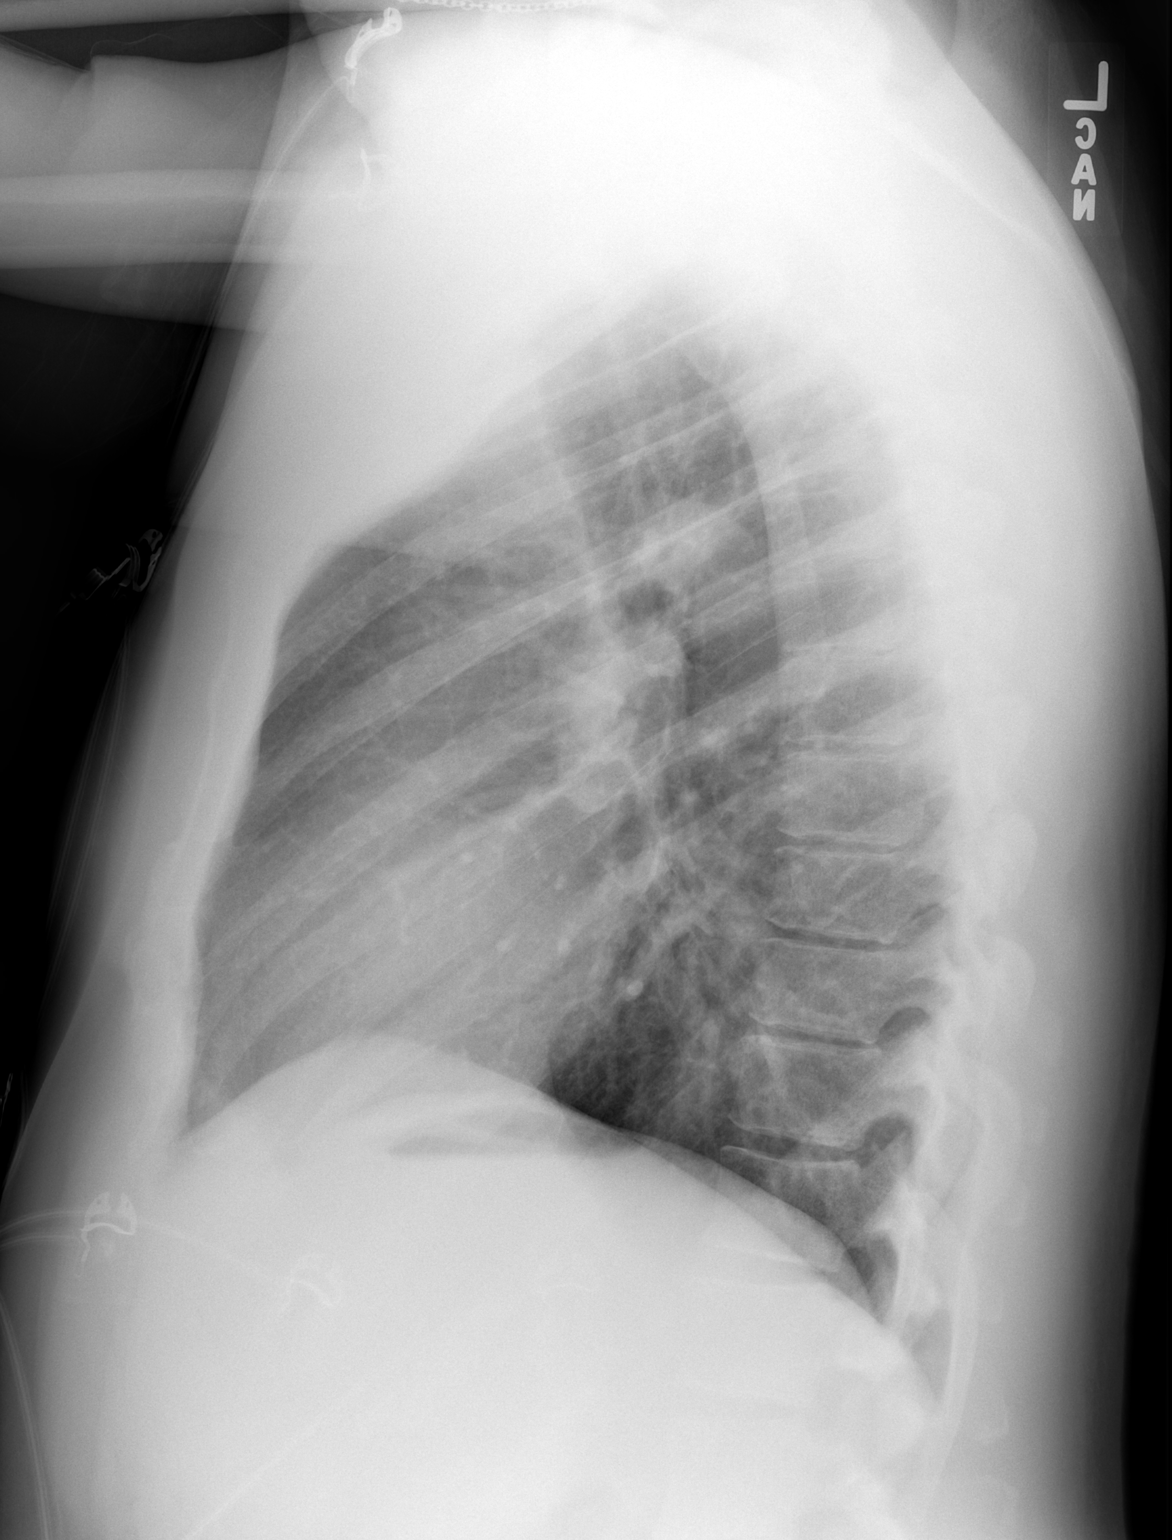

[2 of 2 positions shown; findings below may reference images not displayed]

FINDINGS: The heart size and mediastinal contours are within normal limits.
Both lungs are clear. The visualized skeletal structures are
unremarkable.
IMPRESSION: Normal examination.

## 2022-02-26 ENCOUNTER — Encounter (HOSPITAL_COMMUNITY): Payer: Self-pay

## 2022-02-26 ENCOUNTER — Ambulatory Visit (HOSPITAL_COMMUNITY)
Admission: EM | Admit: 2022-02-26 | Discharge: 2022-02-26 | Disposition: A | Discharge status: Home / Self Care | Payer: Self-pay | Attending: Nurse Practitioner | Admitting: Nurse Practitioner

## 2022-02-26 ENCOUNTER — Ambulatory Visit (INDEPENDENT_AMBULATORY_CARE_PROVIDER_SITE_OTHER): Payer: Self-pay

## 2022-02-26 DIAGNOSIS — M25511 Pain in right shoulder: Secondary | ICD-10-CM

## 2022-02-26 DIAGNOSIS — S46911A Strain of unspecified muscle, fascia and tendon at shoulder and upper arm level, right arm, initial encounter: Secondary | ICD-10-CM

## 2022-02-26 MED ORDER — NAPROXEN 500 MG PO TABS: 500.0000 mg | ORAL_TABLET | Freq: Two times a day (BID) | ORAL | 0 refills | Status: AC

## 2022-02-26 MED ORDER — CYCLOBENZAPRINE HCL 10 MG PO TABS: 10.0000 mg | ORAL_TABLET | Freq: Two times a day (BID) | ORAL | 0 refills | Status: AC | PRN

## 2022-02-26 NOTE — Discharge Instructions (Signed)
Naproxen twice daily for 7 days Flexeril as needed.  Please note this medication will make you drowsy.  No alcohol or driving while you are on this medication Heat to the shoulder as needed Rest Follow-up with your PCP if your symptoms do not improve Please go to the emergency room if your symptoms worsen

## 2022-02-26 NOTE — ED Triage Notes (Signed)
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 States heard a loud pop and is unable to lift the right arm. No previous injuries or dislocations.   Onset: 2-3 hours ago   Prescriptions or OTC medications tried: No

## 2022-02-26 NOTE — ED Provider Notes (Signed)
[facility]    CSN: [id number] Arrival date & time: 02/26/22  1518      History   Chief Complaint Chief Complaint  Patient presents with   Shoulder Injury    HPI Patrick Meyer is a 52 y.o. male who presents for evaluation of an injury to his right shoulder x 3 hours. The injury occurred when patient was working on his truck and was pulling when he felt a pop in his right shoulder and had immediate onset of pain.  Denies any swelling or bruising.  Pain is with movement.  States he is unable to raise his arm above his head due to the pain.  No neck or back pain.No numbness, tingling, weakness, bruising, swelling or open wound.No history of fractures/surgeries to the right shoulder in the past.  He has not taken any OTC medications since symptom onset.  He denies any other injuries or concerns at this time.    Shoulder Injury    History reviewed. No pertinent past medical history.  Patient Active Problem List   Diagnosis Date Noted   SIRS (systemic inflammatory response syndrome) (HCC) 09/25/2017   Dehydration 09/25/2017   AKI (acute kidney injury) (HCC) 09/25/2017   Nausea vomiting and diarrhea 09/25/2017   Chest pain 09/25/2017    History reviewed. No pertinent surgical history.     Home Medications    Prior to Admission medications   Medication Sig Start Date End Date Taking? Authorizing Provider  cyclobenzaprine (FLEXERIL) 10 MG tablet Take 1 tablet (10 mg total) by mouth 2 (two) times daily as needed for muscle spasms. 02/26/22  Yes Radford Pax, NP  naproxen (NAPROSYN) 500 MG tablet Take 1 tablet (500 mg total) by mouth 2 (two) times daily for 7 days. 02/26/22 03/05/22 Yes Radford Pax, NP  ibuprofen (ADVIL,MOTRIN) 200 MG tablet Take 400 mg by mouth every 6 (six) hours as needed for moderate pain.    [provider]  Multiple Vitamin (MULTIVITAMIN WITH MINERALS) TABS tablet Take 1 tablet by mouth daily.    [provider]   ondansetron (ZOFRAN ODT) 4 MG disintegrating tablet Take 1 tablet (4 mg total) by mouth every 8 (eight) hours as needed for nausea or vomiting. Patient not taking: Reported on 09/26/2017 09/24/17   Tilden Fossa, MD  ranitidine (ZANTAC) 150 MG tablet Take 150 mg by mouth 2 (two) times daily as needed for heartburn.    [provider]    Family History Family History  Problem Relation Age of Onset   Diabetes Mother    Hypertension Mother    CAD Neg Hx    Cancer Neg Hx    Stroke Neg Hx     Social History Social History   Tobacco Use   Smoking status: Never   Smokeless tobacco: Never  Substance Use Topics   Alcohol use: Never   Drug use: Never     Allergies   Patient has no known allergies.   Review of Systems Review of Systems  Musculoskeletal:        Right shoulder pain      Physical Exam Triage Vital Signs ED Triage Vitals  Enc Vitals Group     BP 02/26/22 1657 (!) 142/99     Pulse Rate 02/26/22 1657 82     Resp 02/26/22 1657 16     Temp 02/26/22 1657 98.8 F (37.1 C)     Temp Source 02/26/22 1657 Oral     SpO2 02/26/22 1657 94 %  Weight 02/26/22 1656 218 lb (98.9 kg)     Height 02/26/22 1656 6\' 1"  (1.854 m)     Head Circumference --      Peak Flow --      Pain Score 02/26/22 1656 9     Pain Loc --      Pain Edu? --      Excl. in [place]? --    No data found.  Updated Vital Signs BP (!) 142/99 (BP Location: Left Arm)   Pulse 82   Temp 98.8 F (37.1 C) (Oral)   Resp 16   Ht 6\' 1"  (1.854 m)   Wt 218 lb (98.9 kg)   SpO2 94%   BMI 28.76 kg/m   Visual Acuity Right Eye Distance:   Left Eye Distance:   Bilateral Distance:    Right Eye Near:   Left Eye Near:    Bilateral Near:     Physical Exam Vitals and nursing note reviewed.  Constitutional:      Appearance: Normal appearance.  HENT:     Head: Normocephalic and atraumatic.  Eyes:     Pupils: Pupils are equal, round, and reactive to light.  Cardiovascular:     Rate and  Rhythm: Normal rate.  Pulmonary:     Effort: Pulmonary effort is normal.  Musculoskeletal:     Right shoulder: Tenderness present. No swelling, deformity, effusion, laceration, bony tenderness or crepitus. Decreased range of motion. Normal strength. Normal pulse.     Left shoulder: Normal.     Comments: Tender to palpation to lateral right shoulder.  Positive Michel Bickers test.  Unable to perform Neer's test due to pain.  Strength 5 out of 5 bilateral upper extremities  Skin:    General: Skin is warm and dry.  Neurological:     General: No focal deficit present.     Mental Status: He is alert and oriented to person, place, and time.  Psychiatric:        Mood and Affect: Mood normal.        Behavior: Behavior normal.      UC Treatments / Results  Labs (all labs ordered are listed, but only abnormal results are displayed) Labs Reviewed - No data to display  EKG   Radiology DG Shoulder Right  Result Date: 02/26/2022 CLINICAL DATA:  Felt a pop while working on a truck today. EXAM: RIGHT SHOULDER - 2+ VIEW COMPARISON:  None available aside from previous chest radiographs. FINDINGS: There is no evidence of fracture or dislocation. There is no evidence of arthropathy or other focal bone abnormality. Soft tissues are unremarkable. IMPRESSION: Negative. Electronically Signed   By: Zetta Bills M.D.   On: 02/26/2022 18:05    Procedures Procedures (including critical care time)  Medications Ordered in UC Medications - No data to display  Initial Impression / Assessment and Plan / UC Course  I have reviewed the triage vital signs and the nursing notes.  Pertinent labs & imaging results that were available during my care of the patient were reviewed by me and considered in my medical decision making (see chart for details).     Reviewed exam and symptoms with patient.  X-ray negative for fracture Discussed strain of right shoulder Trial of naproxen and Flexeril Rest and heat  to the shoulder as needed PCP follow-up if symptoms do not improve ER precautions reviewed and patient verbalized understanding Final Clinical Impressions(s) / UC Diagnoses   Final diagnoses:  Acute pain of right shoulder  Strain of right shoulder, initial encounter     Discharge Instructions      Naproxen twice daily for 7 days Flexeril as needed.  Please note this medication will make you drowsy.  No alcohol or driving while you are on this medication Heat to the shoulder as needed Rest Follow-up with your PCP if your symptoms do not improve Please go to the emergency room if your symptoms worsen     ED Prescriptions     Medication Sig Dispense Auth. Provider   naproxen (NAPROSYN) 500 MG tablet Take 1 tablet (500 mg total) by mouth 2 (two) times daily for 7 days. 14 tablet Melynda Ripple, NP   cyclobenzaprine (FLEXERIL) 10 MG tablet Take 1 tablet (10 mg total) by mouth 2 (two) times daily as needed for muscle spasms. 10 tablet Melynda Ripple, NP      PDMP not reviewed this encounter.   Melynda Ripple, NP 02/26/22 [phone]
